# Patient Record
Sex: Female | Born: 1985 | Hispanic: Yes | Marital: Married | Smoking: Never smoker
Health system: Southern US, Community
[De-identification: ages and names within clinical notes are randomized; demographics above are authoritative.]

## PROBLEM LIST (undated history)

## (undated) ENCOUNTER — Inpatient Hospital Stay (HOSPITAL_COMMUNITY): Payer: Self-pay

---

## 2007-08-16 ENCOUNTER — Emergency Department (HOSPITAL_COMMUNITY): Admission: EM | Admit: 2007-08-16 | Discharge: 2007-08-16 | Payer: Self-pay | Admitting: Emergency Medicine

## 2012-05-06 ENCOUNTER — Emergency Department (HOSPITAL_COMMUNITY)
Admission: EM | Admit: 2012-05-06 | Discharge: 2012-05-06 | Disposition: A | Discharge status: Home / Self Care | Payer: No Typology Code available for payment source | Attending: Emergency Medicine | Admitting: Emergency Medicine

## 2012-05-06 ENCOUNTER — Emergency Department (HOSPITAL_COMMUNITY): Payer: No Typology Code available for payment source

## 2012-05-06 ENCOUNTER — Encounter (HOSPITAL_COMMUNITY): Payer: Self-pay | Admitting: Family Medicine

## 2012-05-06 DIAGNOSIS — S0990XA Unspecified injury of head, initial encounter: Secondary | ICD-10-CM | POA: Insufficient documentation

## 2012-05-06 DIAGNOSIS — Y9389 Activity, other specified: Secondary | ICD-10-CM | POA: Insufficient documentation

## 2012-05-06 DIAGNOSIS — S139XXA Sprain of joints and ligaments of unspecified parts of neck, initial encounter: Secondary | ICD-10-CM

## 2012-05-06 DIAGNOSIS — Y92410 Unspecified street and highway as the place of occurrence of the external cause: Secondary | ICD-10-CM | POA: Insufficient documentation

## 2012-05-06 DIAGNOSIS — S298XXA Other specified injuries of thorax, initial encounter: Secondary | ICD-10-CM | POA: Insufficient documentation

## 2012-05-06 MED ORDER — CYCLOBENZAPRINE HCL 10 MG PO TABS: 10.0000 mg | ORAL_TABLET | Freq: Three times a day (TID) | ORAL | Status: DC | PRN

## 2012-05-06 MED ORDER — HYDROCODONE-ACETAMINOPHEN 5-325 MG PO TABS: 1.0000 | ORAL_TABLET | ORAL | Status: DC | PRN

## 2012-05-06 MED ORDER — ACETAMINOPHEN 325 MG PO TABS
650.0000 mg | ORAL_TABLET | Freq: Once | ORAL | Status: AC
  Administered 2012-05-06: 650 mg via ORAL
  Filled 2012-05-06: qty 2
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
## 2012-11-18 ENCOUNTER — Ambulatory Visit (HOSPITAL_COMMUNITY)
Admission: RE | Admit: 2012-11-18 | Discharge: 2012-11-18 | Disposition: A | Discharge status: Home / Self Care | Payer: Self-pay | Source: Ambulatory Visit | Attending: Nurse Practitioner | Admitting: Nurse Practitioner

## 2012-11-18 ENCOUNTER — Other Ambulatory Visit (HOSPITAL_COMMUNITY): Payer: Self-pay | Admitting: Nurse Practitioner

## 2012-11-18 DIAGNOSIS — R05 Cough: Secondary | ICD-10-CM

## 2012-11-18 DIAGNOSIS — R059 Cough, unspecified: Secondary | ICD-10-CM | POA: Insufficient documentation

## 2013-04-16 ENCOUNTER — Other Ambulatory Visit (HOSPITAL_COMMUNITY): Payer: Self-pay | Admitting: Family Medicine

## 2013-04-16 DIAGNOSIS — N63 Unspecified lump in unspecified breast: Secondary | ICD-10-CM

## 2013-04-21 ENCOUNTER — Ambulatory Visit (HOSPITAL_COMMUNITY)
Admission: RE | Admit: 2013-04-21 | Discharge: 2013-04-21 | Disposition: A | Discharge status: Home / Self Care | Payer: BC Managed Care – PPO | Source: Ambulatory Visit | Attending: Family Medicine | Admitting: Family Medicine

## 2013-04-21 DIAGNOSIS — N644 Mastodynia: Secondary | ICD-10-CM | POA: Insufficient documentation

## 2013-04-21 DIAGNOSIS — N63 Unspecified lump in unspecified breast: Secondary | ICD-10-CM

## 2014-12-16 ENCOUNTER — Other Ambulatory Visit (HOSPITAL_COMMUNITY): Payer: Self-pay | Admitting: Family Medicine

## 2014-12-16 DIAGNOSIS — N63 Unspecified lump in unspecified breast: Secondary | ICD-10-CM

## 2014-12-27 ENCOUNTER — Ambulatory Visit (HOSPITAL_COMMUNITY): Payer: Self-pay

## 2014-12-27 ENCOUNTER — Ambulatory Visit (HOSPITAL_COMMUNITY)
Admission: RE | Admit: 2014-12-27 | Discharge: 2014-12-27 | Disposition: A | Discharge status: Home / Self Care | Payer: BLUE CROSS/BLUE SHIELD | Source: Ambulatory Visit | Attending: Family Medicine | Admitting: Family Medicine

## 2014-12-27 DIAGNOSIS — N63 Unspecified lump in unspecified breast: Secondary | ICD-10-CM

## 2014-12-27 DIAGNOSIS — N6002 Solitary cyst of left breast: Secondary | ICD-10-CM | POA: Insufficient documentation

## 2015-05-30 ENCOUNTER — Other Ambulatory Visit (HOSPITAL_COMMUNITY): Payer: Self-pay | Admitting: Registered Nurse

## 2015-05-30 DIAGNOSIS — N6002 Solitary cyst of left breast: Secondary | ICD-10-CM

## 2015-07-04 ENCOUNTER — Other Ambulatory Visit (HOSPITAL_COMMUNITY): Payer: BLUE CROSS/BLUE SHIELD

## 2015-07-04 ENCOUNTER — Ambulatory Visit (HOSPITAL_COMMUNITY)
Admission: RE | Admit: 2015-07-04 | Discharge: 2015-07-04 | Disposition: A | Discharge status: Home / Self Care | Payer: BLUE CROSS/BLUE SHIELD | Source: Ambulatory Visit | Attending: Registered Nurse | Admitting: Registered Nurse

## 2015-07-04 DIAGNOSIS — N6002 Solitary cyst of left breast: Secondary | ICD-10-CM | POA: Diagnosis not present

## 2015-07-04 DIAGNOSIS — N63 Unspecified lump in breast: Secondary | ICD-10-CM | POA: Insufficient documentation

## 2015-12-18 DIAGNOSIS — Z1389 Encounter for screening for other disorder: Secondary | ICD-10-CM | POA: Diagnosis not present

## 2015-12-18 DIAGNOSIS — R1310 Dysphagia, unspecified: Secondary | ICD-10-CM | POA: Diagnosis not present

## 2015-12-18 DIAGNOSIS — R110 Nausea: Secondary | ICD-10-CM | POA: Diagnosis not present

## 2015-12-18 DIAGNOSIS — Z0001 Encounter for general adult medical examination with abnormal findings: Secondary | ICD-10-CM | POA: Diagnosis not present

## 2015-12-18 DIAGNOSIS — Z6830 Body mass index (BMI) 30.0-30.9, adult: Secondary | ICD-10-CM | POA: Diagnosis not present

## 2015-12-19 DIAGNOSIS — Z0000 Encounter for general adult medical examination without abnormal findings: Secondary | ICD-10-CM | POA: Diagnosis not present

## 2015-12-19 DIAGNOSIS — Z6830 Body mass index (BMI) 30.0-30.9, adult: Secondary | ICD-10-CM | POA: Diagnosis not present

## 2015-12-19 DIAGNOSIS — Z1389 Encounter for screening for other disorder: Secondary | ICD-10-CM | POA: Diagnosis not present

## 2015-12-19 DIAGNOSIS — E6609 Other obesity due to excess calories: Secondary | ICD-10-CM | POA: Diagnosis not present

## 2015-12-20 ENCOUNTER — Encounter: Payer: Self-pay | Admitting: Internal Medicine

## 2016-01-01 ENCOUNTER — Ambulatory Visit: Payer: BLUE CROSS/BLUE SHIELD | Admitting: Gastroenterology

## 2016-01-17 ENCOUNTER — Ambulatory Visit: Payer: BLUE CROSS/BLUE SHIELD | Admitting: Gastroenterology

## 2016-01-18 ENCOUNTER — Ambulatory Visit (INDEPENDENT_AMBULATORY_CARE_PROVIDER_SITE_OTHER): Payer: BLUE CROSS/BLUE SHIELD | Admitting: Gastroenterology

## 2016-01-18 ENCOUNTER — Encounter: Payer: Self-pay | Admitting: Gastroenterology

## 2016-01-18 DIAGNOSIS — R194 Change in bowel habit: Secondary | ICD-10-CM | POA: Diagnosis not present

## 2016-01-18 DIAGNOSIS — R1030 Lower abdominal pain, unspecified: Secondary | ICD-10-CM

## 2016-01-18 DIAGNOSIS — R6881 Early satiety: Secondary | ICD-10-CM

## 2016-01-18 DIAGNOSIS — R1310 Dysphagia, unspecified: Secondary | ICD-10-CM | POA: Diagnosis not present

## 2016-01-18 MED ORDER — OMEPRAZOLE MAGNESIUM 20 MG PO TBEC: 20.0000 mg | DELAYED_RELEASE_TABLET | Freq: Every day | ORAL | 0 refills | Status: DC

## 2016-01-18 MED ORDER — DEXLANSOPRAZOLE 60 MG PO CPDR: 60.0000 mg | DELAYED_RELEASE_CAPSULE | Freq: Every day | ORAL | 0 refills | Status: DC

## 2016-01-18 NOTE — Progress Notes (Signed)
Primary Care Physician:  Glo Herring., MD  Primary Gastroenterologist:  Garfield Cornea, MD   Chief Complaint  Patient presents with  . Dysphagia    feels like food gets stuck, chest pain at times  . Abdominal Pain    lower mid abd, pain right groin  . Nausea  . Constipation  . Bloated    HPI:  Cheryl Aguilar is a 30 y.o. female here at the request of PCP for dysphagia. She has multiple GI complaints today. Complains of several month h/o pp early satiety, nausea but no vomiting. Can only eat much less than usual and then complains of fullness and discomfort. Has to get up and walk around. Stomach becomes tight. Sometimes relief if passes gas. Complains of lower abdominal pain as well with some pain into right groin. Some relief with BM. Present now for couple of months. She also has had change in bowels, use to have BM 3 times pr day. Now only once daily. Has to sit for awhile to get BM to come. No melena, brbpr. No heartburn. Feels like food sits in throat, especially grape tomatoes. Took Nexium 24hr for about 5-7 days. No significant improvement. Denies ASA powders. Rare NSAID use.   Menstrual cycles regular.   Current Outpatient Prescriptions  Medication Sig Dispense Refill  . ibuprofen (ADVIL,MOTRIN) 200 MG tablet Take 400 mg by mouth every 6 (six) hours as needed for pain.    .        No current facility-administered medications for this visit.     Allergies as of 01/18/2016  . (No Known Allergies)    No pertinent past medical history.  History reviewed. No pertinent surgical history.  Family History  Problem Relation Age of Onset  . Diabetes Mother   . Diabetes Brother   . Esophageal cancer Paternal Grandfather   . Colon cancer Neg Hx     Social History   Social History  . Marital status: Single    Spouse name: N/A  . Number of children: N/A  . Years of education: N/A   Occupational History  . Not on file.   Social History Main Topics  . Smoking  status: Never Smoker  . Smokeless tobacco: Never Used  . Alcohol use No  . Drug use: No  . Sexual activity: Yes    Partners: Male     Comment: husband had vasectomy 2014   Other Topics Concern  . Not on file   Social History Narrative  . No narrative on file      ROS:  General: Negative for anorexia, weight loss, fever, chills, fatigue, weakness. Eyes: Negative for vision changes.  ENT: Negative for hoarseness, nasal congestion.see hpi CV: Negative for chest pain, angina, palpitations, dyspnea on exertion, peripheral edema.  Respiratory: Negative for dyspnea at rest, dyspnea on exertion, cough, sputum, wheezing.  GI: See history of present illness. GU:  Negative for dysuria, hematuria, urinary incontinence, urinary frequency, nocturnal urination.  MS: Negative for joint pain, low back pain.  Derm: Negative for rash or itching.  Neuro: Negative for weakness, abnormal sensation, seizure, frequent headaches, memory loss, confusion.  Psych: Negative for anxiety, depression, suicidal ideation, hallucinations.  Endo: Negative for unusual weight change.  Heme: Negative for bruising or bleeding. Allergy: Negative for rash or hives.    Physical Examination:  BP 113/75   Pulse 89   Temp 98.2 F (36.8 C) (Oral)   Ht 5\' 4"  (1.626 m)   Wt 174 lb 9.6 oz (79.2 kg)  LMP 01/05/2016 (Exact Date)   BMI 29.97 kg/m    General: Well-nourished, well-developed in no acute distress.  Head: Normocephalic, atraumatic.   Eyes: Conjunctiva pink, no icterus. Mouth: Oropharyngeal mucosa moist and pink , no lesions erythema or exudate. Neck: Supple without thyromegaly, masses, or lymphadenopathy.  Lungs: Clear to auscultation bilaterally.  Heart: Regular rate and rhythm, no murmurs rubs or gallops.  Abdomen: Bowel sounds are normal, mild epig tenderness and lower abd tenderness, nondistended, no hepatosplenomegaly or masses, no abdominal bruits or    hernia , no rebound or guarding.   Rectal:  not performed Extremities: No lower extremity edema. No clubbing or deformities.  Neuro: Alert and oriented x 4 , grossly normal neurologically.  Skin: Warm and dry, no rash or jaundice.   Psych: Alert and cooperative, normal mood and affect.  Labs: requested  Imaging Studies: No results found.

## 2016-01-18 NOTE — Assessment & Plan Note (Signed)
Cannot rule out gastritis, h.pylori, GERD. Trial of PPI. Await records on labs. Needs to be checked for H.pylori if not already.

## 2016-01-18 NOTE — Assessment & Plan Note (Signed)
Lower abdominal pain in setting of bowel change, decreased stool frequency and incomplete stools. Add Miralax as outlined. PR in 2 weeks. Requested labs from PCP for review.

## 2016-01-18 NOTE — Patient Instructions (Signed)
1. Dexilant once daily before breakfast. Samples provided. Call in 2 weeks and let me know if any of your symptoms are better.  2. Start Miralax one capful at bedtime daily for 2 weeks. You can buy over the counter. If you continue to have abdominal pain, bloating with improved bowel function then we will need to do more testing. Call in 2 weeks and let me know how you are doing.  3. I will obtain copy of your recent labs. If you have not been checked for H.pylori or your thyroid function has not been checked we will let you know.

## 2016-01-18 NOTE — Assessment & Plan Note (Signed)
30 y/o female with vague h/o dysphagia to certain foods. Feels like sticking in throat. No typical heartburn but vague discomfort in chest especially in the morning. ?atypical GERD. No trial of PPI for significant period of time. Will start omeprazole 20mg  daily for two weeks. Samples provided. PR in 2 weeks via phone. Should see some improvement at that time, if not consider further work up.

## 2016-01-19 NOTE — Progress Notes (Signed)
cc'ed to pcp °

## 2016-01-23 ENCOUNTER — Telehealth: Payer: Self-pay

## 2016-01-23 DIAGNOSIS — R1013 Epigastric pain: Secondary | ICD-10-CM

## 2016-01-23 NOTE — Telephone Encounter (Signed)
Pt called office and said she was given Prilosec samples last week at [facility]. States the feeling of food getting stuck is better, but nausea is worse. No vomiting. No fever. Feels sluggish. Nausea occurs after eating. Ate a salad today and then felt nauseated. Asked if something for nausea could be sent in to [facility] in [place].

## 2016-01-24 MED ORDER — ONDANSETRON 4 MG PO TBDP: 4.0000 mg | ORAL_TABLET | Freq: Three times a day (TID) | ORAL | 0 refills | Status: DC | PRN

## 2016-01-24 NOTE — Telephone Encounter (Signed)
LMOVM and informed pt.

## 2016-01-24 NOTE — Addendum Note (Signed)
Addended by: Mahala Menghini on: 01/24/2016 02:08 PM   Modules accepted: Orders

## 2016-01-24 NOTE — Telephone Encounter (Signed)
RX for nausea med sent in. Too soon to tell if meds will help. Continue recommendations as per OV note.   I still need copy of her labs from PCP.

## 2016-02-02 ENCOUNTER — Telehealth: Payer: Self-pay

## 2016-02-02 NOTE — Telephone Encounter (Signed)
Labs reviewed from PCP dated 12/19/2015 White blood cell count 8200, hemoglobin 13.3, hematocrit 39.9, platelets 343,000, BUN 14, creatinine less than 0.57, sodium 139, total bilirubin less than 0.3, alkaline phosphatase 99, AST 17, ALT 15, albumin 4.3, TSH 2.950   Please touch base with patient.  Her TSH was normal in 12/2015. I don't see where she was checked for H.pylori.   I would advise her to have labs for H.pylori, [facility] still does the serologies.   Return to the office in 3 weeks. If no significant improvement in her symptoms, and her H.pylori is negative, then we will likely offer her EGD/ED at that time.

## 2016-02-02 NOTE — Telephone Encounter (Signed)
Pt called and will come by to pick up the lab orders. She is aware of our office closing times.  Orders left at front for pick up.

## 2016-02-02 NOTE — Telephone Encounter (Signed)
See separate note. I have spoke to pt.

## 2016-02-02 NOTE — Telephone Encounter (Signed)
Pt was returning a call to DS. Please call her back at [phone]

## 2016-02-02 NOTE — Addendum Note (Signed)
Addended by: Mahala Menghini on: 02/02/2016 08:12 AM   Modules accepted: Orders

## 2016-02-02 NOTE — Telephone Encounter (Signed)
LMOM to call soon, we are off at noon today until next Wed.

## 2016-02-20 ENCOUNTER — Encounter: Payer: Self-pay | Admitting: Internal Medicine

## 2016-03-15 ENCOUNTER — Other Ambulatory Visit: Payer: Self-pay | Admitting: Registered Nurse

## 2016-03-15 DIAGNOSIS — N6320 Unspecified lump in the left breast, unspecified quadrant: Secondary | ICD-10-CM

## 2016-04-12 DIAGNOSIS — Z23 Encounter for immunization: Secondary | ICD-10-CM | POA: Diagnosis not present

## 2016-06-06 DIAGNOSIS — Z1389 Encounter for screening for other disorder: Secondary | ICD-10-CM | POA: Diagnosis not present

## 2016-06-06 DIAGNOSIS — L299 Pruritus, unspecified: Secondary | ICD-10-CM | POA: Diagnosis not present

## 2016-06-06 DIAGNOSIS — Z23 Encounter for immunization: Secondary | ICD-10-CM | POA: Diagnosis not present

## 2016-06-06 DIAGNOSIS — B354 Tinea corporis: Secondary | ICD-10-CM | POA: Diagnosis not present

## 2016-07-04 DIAGNOSIS — Z1389 Encounter for screening for other disorder: Secondary | ICD-10-CM | POA: Diagnosis not present

## 2016-07-04 DIAGNOSIS — S70269A Insect bite (nonvenomous), unspecified hip, initial encounter: Secondary | ICD-10-CM | POA: Diagnosis not present

## 2016-07-04 DIAGNOSIS — E663 Overweight: Secondary | ICD-10-CM | POA: Diagnosis not present

## 2016-07-04 DIAGNOSIS — Z6829 Body mass index (BMI) 29.0-29.9, adult: Secondary | ICD-10-CM | POA: Diagnosis not present

## 2016-10-07 ENCOUNTER — Other Ambulatory Visit (HOSPITAL_COMMUNITY): Payer: Self-pay | Admitting: Family Medicine

## 2016-10-07 DIAGNOSIS — N6320 Unspecified lump in the left breast, unspecified quadrant: Secondary | ICD-10-CM

## 2016-12-19 DIAGNOSIS — Z6827 Body mass index (BMI) 27.0-27.9, adult: Secondary | ICD-10-CM | POA: Diagnosis not present

## 2016-12-19 DIAGNOSIS — E663 Overweight: Secondary | ICD-10-CM | POA: Diagnosis not present

## 2016-12-19 DIAGNOSIS — Z124 Encounter for screening for malignant neoplasm of cervix: Secondary | ICD-10-CM | POA: Diagnosis not present

## 2016-12-19 DIAGNOSIS — Z1389 Encounter for screening for other disorder: Secondary | ICD-10-CM | POA: Diagnosis not present

## 2016-12-19 DIAGNOSIS — Z23 Encounter for immunization: Secondary | ICD-10-CM | POA: Diagnosis not present

## 2016-12-19 DIAGNOSIS — Z0000 Encounter for general adult medical examination without abnormal findings: Secondary | ICD-10-CM | POA: Diagnosis not present

## 2017-10-01 DIAGNOSIS — Z1389 Encounter for screening for other disorder: Secondary | ICD-10-CM | POA: Diagnosis not present

## 2017-10-01 DIAGNOSIS — J019 Acute sinusitis, unspecified: Secondary | ICD-10-CM | POA: Diagnosis not present

## 2017-10-01 DIAGNOSIS — J301 Allergic rhinitis due to pollen: Secondary | ICD-10-CM | POA: Diagnosis not present

## 2017-10-01 DIAGNOSIS — Z6830 Body mass index (BMI) 30.0-30.9, adult: Secondary | ICD-10-CM | POA: Diagnosis not present

## 2017-10-01 DIAGNOSIS — E6609 Other obesity due to excess calories: Secondary | ICD-10-CM | POA: Diagnosis not present

## 2017-12-26 DIAGNOSIS — Z6830 Body mass index (BMI) 30.0-30.9, adult: Secondary | ICD-10-CM | POA: Diagnosis not present

## 2017-12-26 DIAGNOSIS — E6609 Other obesity due to excess calories: Secondary | ICD-10-CM | POA: Diagnosis not present

## 2017-12-26 DIAGNOSIS — Z23 Encounter for immunization: Secondary | ICD-10-CM | POA: Diagnosis not present

## 2017-12-26 DIAGNOSIS — Z1389 Encounter for screening for other disorder: Secondary | ICD-10-CM | POA: Diagnosis not present

## 2017-12-26 DIAGNOSIS — Z0001 Encounter for general adult medical examination with abnormal findings: Secondary | ICD-10-CM | POA: Diagnosis not present

## 2018-01-21 DIAGNOSIS — Z23 Encounter for immunization: Secondary | ICD-10-CM | POA: Diagnosis not present

## 2018-01-21 DIAGNOSIS — Z6830 Body mass index (BMI) 30.0-30.9, adult: Secondary | ICD-10-CM | POA: Diagnosis not present

## 2018-01-21 DIAGNOSIS — Z0001 Encounter for general adult medical examination with abnormal findings: Secondary | ICD-10-CM | POA: Diagnosis not present

## 2018-01-21 DIAGNOSIS — Z1389 Encounter for screening for other disorder: Secondary | ICD-10-CM | POA: Diagnosis not present

## 2018-04-28 DIAGNOSIS — S76012A Strain of muscle, fascia and tendon of left hip, initial encounter: Secondary | ICD-10-CM | POA: Diagnosis not present

## 2018-04-28 DIAGNOSIS — M779 Enthesopathy, unspecified: Secondary | ICD-10-CM | POA: Diagnosis not present

## 2018-04-28 DIAGNOSIS — Z1389 Encounter for screening for other disorder: Secondary | ICD-10-CM | POA: Diagnosis not present

## 2018-04-28 DIAGNOSIS — E6609 Other obesity due to excess calories: Secondary | ICD-10-CM | POA: Diagnosis not present

## 2018-04-28 DIAGNOSIS — Z6831 Body mass index (BMI) 31.0-31.9, adult: Secondary | ICD-10-CM | POA: Diagnosis not present

## 2018-10-06 DIAGNOSIS — N924 Excessive bleeding in the premenopausal period: Secondary | ICD-10-CM | POA: Diagnosis not present

## 2018-10-06 DIAGNOSIS — N920 Excessive and frequent menstruation with regular cycle: Secondary | ICD-10-CM | POA: Diagnosis not present

## 2018-10-06 DIAGNOSIS — Z6832 Body mass index (BMI) 32.0-32.9, adult: Secondary | ICD-10-CM | POA: Diagnosis not present

## 2018-10-06 DIAGNOSIS — E669 Obesity, unspecified: Secondary | ICD-10-CM | POA: Diagnosis not present

## 2018-10-23 DIAGNOSIS — M79621 Pain in right upper arm: Secondary | ICD-10-CM | POA: Diagnosis not present

## 2018-10-23 DIAGNOSIS — E6609 Other obesity due to excess calories: Secondary | ICD-10-CM | POA: Diagnosis not present

## 2018-10-23 DIAGNOSIS — N924 Excessive bleeding in the premenopausal period: Secondary | ICD-10-CM | POA: Diagnosis not present

## 2018-10-23 DIAGNOSIS — Z6831 Body mass index (BMI) 31.0-31.9, adult: Secondary | ICD-10-CM | POA: Diagnosis not present

## 2018-11-03 DIAGNOSIS — N920 Excessive and frequent menstruation with regular cycle: Secondary | ICD-10-CM | POA: Diagnosis not present

## 2018-11-03 DIAGNOSIS — D649 Anemia, unspecified: Secondary | ICD-10-CM | POA: Diagnosis not present

## 2018-11-03 DIAGNOSIS — E039 Hypothyroidism, unspecified: Secondary | ICD-10-CM | POA: Diagnosis not present

## 2018-11-03 DIAGNOSIS — E063 Autoimmune thyroiditis: Secondary | ICD-10-CM | POA: Diagnosis not present

## 2018-11-03 DIAGNOSIS — N938 Other specified abnormal uterine and vaginal bleeding: Secondary | ICD-10-CM | POA: Diagnosis not present

## 2018-11-03 DIAGNOSIS — Z6831 Body mass index (BMI) 31.0-31.9, adult: Secondary | ICD-10-CM | POA: Diagnosis not present

## 2018-11-03 DIAGNOSIS — N924 Excessive bleeding in the premenopausal period: Secondary | ICD-10-CM | POA: Diagnosis not present

## 2019-02-02 ENCOUNTER — Other Ambulatory Visit: Payer: Self-pay | Admitting: Obstetrics and Gynecology

## 2019-02-02 DIAGNOSIS — Z01419 Encounter for gynecological examination (general) (routine) without abnormal findings: Secondary | ICD-10-CM | POA: Diagnosis not present

## 2019-02-02 DIAGNOSIS — Z6831 Body mass index (BMI) 31.0-31.9, adult: Secondary | ICD-10-CM | POA: Diagnosis not present

## 2019-02-02 DIAGNOSIS — N6320 Unspecified lump in the left breast, unspecified quadrant: Secondary | ICD-10-CM | POA: Diagnosis not present

## 2019-02-02 DIAGNOSIS — E039 Hypothyroidism, unspecified: Secondary | ICD-10-CM | POA: Insufficient documentation

## 2019-02-02 DIAGNOSIS — N926 Irregular menstruation, unspecified: Secondary | ICD-10-CM | POA: Diagnosis not present

## 2019-02-02 DIAGNOSIS — N6332 Unspecified lump in axillary tail of the left breast: Secondary | ICD-10-CM

## 2019-02-10 DIAGNOSIS — E785 Hyperlipidemia, unspecified: Secondary | ICD-10-CM | POA: Diagnosis not present

## 2019-02-10 DIAGNOSIS — Z13220 Encounter for screening for lipoid disorders: Secondary | ICD-10-CM | POA: Diagnosis not present

## 2019-02-16 DIAGNOSIS — N926 Irregular menstruation, unspecified: Secondary | ICD-10-CM | POA: Diagnosis not present

## 2019-02-19 ENCOUNTER — Other Ambulatory Visit: Payer: Self-pay

## 2019-03-11 ENCOUNTER — Other Ambulatory Visit: Payer: Self-pay

## 2019-03-11 ENCOUNTER — Ambulatory Visit
Admission: RE | Admit: 2019-03-11 | Discharge: 2019-03-11 | Disposition: A | Discharge status: Home / Self Care | Payer: BC Managed Care – PPO | Source: Ambulatory Visit | Attending: Obstetrics and Gynecology | Admitting: Obstetrics and Gynecology

## 2019-03-11 ENCOUNTER — Other Ambulatory Visit: Payer: Self-pay | Admitting: Obstetrics and Gynecology

## 2019-03-11 DIAGNOSIS — N6312 Unspecified lump in the right breast, upper inner quadrant: Secondary | ICD-10-CM | POA: Diagnosis not present

## 2019-03-11 DIAGNOSIS — N6332 Unspecified lump in axillary tail of the left breast: Secondary | ICD-10-CM

## 2019-03-11 DIAGNOSIS — N6310 Unspecified lump in the right breast, unspecified quadrant: Secondary | ICD-10-CM

## 2019-03-11 DIAGNOSIS — N6002 Solitary cyst of left breast: Secondary | ICD-10-CM | POA: Diagnosis not present

## 2019-06-01 ENCOUNTER — Other Ambulatory Visit: Payer: Self-pay

## 2019-06-01 ENCOUNTER — Ambulatory Visit
Admission: EM | Admit: 2019-06-01 | Discharge: 2019-06-01 | Disposition: A | Discharge status: Home / Self Care | Payer: BC Managed Care – PPO | Attending: Emergency Medicine | Admitting: Emergency Medicine

## 2019-06-01 ENCOUNTER — Encounter: Payer: Self-pay | Admitting: Emergency Medicine

## 2019-06-01 DIAGNOSIS — Z113 Encounter for screening for infections with a predominantly sexual mode of transmission: Secondary | ICD-10-CM | POA: Diagnosis not present

## 2019-06-01 DIAGNOSIS — Z79899 Other long term (current) drug therapy: Secondary | ICD-10-CM | POA: Diagnosis not present
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
## 2019-06-01 NOTE — ED Provider Notes (Signed)
[facility]   [id number] 06/01/19 Arrival Time: 1857   [id number] DISCHARGE  SUBJECTIVE:  Cheryl Aguilar is a 34 y.o. female who presents with complaints of STD screening.  She denies a precipitating event, recent sexual encounter or recent antibiotic use.  Patient is sexually active with 1 female partner.  Denies vaginal discharge.  Denies similar symptoms in the past she denies fever, chills, nausea, vomiting, abdominal or pelvic pain, urinary symptoms, vaginal itching, vaginal odor, vaginal bleeding, dyspareunia, vaginal rashes or lesions.   No LMP recorded. Current birth control method: Compliant with BC:  ROS: As per HPI.  All other pertinent ROS negative.     History reviewed. No pertinent past medical history. History reviewed. No pertinent surgical history. No Known Allergies No current facility-administered medications on file prior to encounter.   Current Outpatient Medications on File Prior to Encounter  Medication Sig Dispense Refill  . ibuprofen (ADVIL,MOTRIN) 200 MG tablet Take 400 mg by mouth every 6 (six) hours as needed for pain.    Marland Kitchen omeprazole (PRILOSEC OTC) 20 MG tablet Take 1 tablet (20 mg total) by mouth daily. 28 tablet 0  . ondansetron (ZOFRAN) 4 MG tablet Take 4 mg by mouth every 8 (eight) hours as needed for nausea or vomiting.    . ondansetron (ZOFRAN-ODT) 4 MG disintegrating tablet Take 1 tablet (4 mg total) by mouth every 8 (eight) hours as needed for nausea or vomiting. (Patient not taking: Reported on 06/01/2019) 30 tablet 0    Social History   Socioeconomic History  . Marital status: Single    Spouse name: Not on file  . Number of children: Not on file  . Years of education: Not on file  . Highest education level: Not on file  Occupational History  . Not on file  Tobacco Use  . Smoking status: Never Smoker  . Smokeless tobacco: Never Used  Substance and Sexual Activity  . Alcohol use: No  . Drug use: No  . Sexual activity:  Yes    Partners: Male    Comment: husband had vasectomy 2014  Other Topics Concern  . Not on file  Social History Narrative  . Not on file   Social Determinants of Health   Financial Resource Strain:   . Difficulty of Paying Living Expenses:   Food Insecurity:   . Worried About Charity fundraiser in the Last Year:   . Arboriculturist in the Last Year:   Transportation Needs:   . Film/video editor (Medical):   Marland Kitchen Lack of Transportation (Non-Medical):   Physical Activity:   . Days of Exercise per Week:   . Minutes of Exercise per Session:   Stress:   . Feeling of Stress :   Social Connections:   . Frequency of Communication with Friends and Family:   . Frequency of Social Gatherings with Friends and Family:   . Attends Religious Services:   . Active Member of Clubs or Organizations:   . Attends Archivist Meetings:   Marland Kitchen Marital Status:   Intimate Partner Violence:   . Fear of Current or Ex-Partner:   . Emotionally Abused:   Marland Kitchen Physically Abused:   . Sexually Abused:    Family History  Problem Relation Age of Onset  . Diabetes Mother   . Diabetes Brother   . Esophageal cancer Paternal Grandfather   . Colon cancer Neg Hx     OBJECTIVE:  Vitals:   06/01/19 1903  BP: 129/74  Pulse: 77  Resp: 17  Temp: 98.2 F (36.8 C)  TempSrc: Oral  SpO2: 98%     General appearance: Alert, NAD, appears stated age Head: NCAT Throat: lips, mucosa, and tongue normal; teeth and gums normal Lungs: CTA bilaterally without adventitious breath sounds Heart: regular rate and rhythm.  Radial pulses 2+ symmetrical bilaterally Back: no CVA tenderness Abdomen: soft, non-tender; bowel sounds normal; no masses or organomegaly; no guarding or rebound tenderness GU: Vaginal self swab obtained Skin: warm and dry Psychological:  Alert and cooperative. Normal mood and affect.  LABS:  No results found for this or any previous visit.  Labs Reviewed  CERVICOVAGINAL ANCILLARY  ONLY    ASSESSMENT & PLAN:  1. Screening for STD (sexually transmitted disease)     No orders of the defined types were placed in this encounter.   Pending: Labs Reviewed  CERVICOVAGINAL ANCILLARY ONLY     Discharge instruction   Vaginal self-swab obtained.  We will call you if results are abnormal If tests results are positive, please abstain from sexual activity until you and your partner(s) have been treated Follow up with PCP or [facility] if symptoms persists Return here or go to ER if you have any new or worsening symptoms fever, chills, nausea, vomiting, abdominal or pelvic pain, painful intercourse, vaginal discharge, vaginal bleeding, persistent symptoms despite treatment, etc...  Reviewed expectations re: course of current medical issues. Questions answered. Outlined signs and symptoms indicating need for more acute intervention. Patient verbalized understanding. After Visit Summary given.       Emerson Monte, FNP 06/01/19 1932

## 2019-06-01 NOTE — Discharge Instructions (Addendum)
Vaginal self-swab obtained.  We will call you if results are abnormal If tests results are positive, please abstain from sexual activity until you and your partner(s) have been treated Follow up with PCP or [facility] if symptoms persists Return here or go to ER if you have any new or worsening symptoms fever, chills, nausea, vomiting, abdominal or pelvic pain, painful intercourse, vaginal discharge, vaginal bleeding, persistent symptoms despite treatment, etc..Marland Kitchen

## 2019-06-02 ENCOUNTER — Telehealth: Payer: Self-pay | Admitting: Emergency Medicine

## 2019-06-02 NOTE — Telephone Encounter (Signed)
STD testing reordered

## 2019-06-03 ENCOUNTER — Telehealth (HOSPITAL_COMMUNITY): Payer: Self-pay

## 2019-06-03 DIAGNOSIS — B3731 Acute candidiasis of vulva and vagina: Secondary | ICD-10-CM

## 2019-06-03 DIAGNOSIS — B373 Candidiasis of vulva and vagina: Secondary | ICD-10-CM

## 2019-06-03 LAB — CERVICOVAGINAL ANCILLARY ONLY
Bacterial Vaginitis (gardnerella): NEGATIVE
Candida Glabrata: POSITIVE — AB
Candida Vaginitis: NEGATIVE
Chlamydia: NEGATIVE
Comment: NEGATIVE
Comment: NEGATIVE
Comment: NEGATIVE
Comment: NEGATIVE
Comment: NEGATIVE
Comment: NORMAL
Neisseria Gonorrhea: NEGATIVE
Trichomonas: NEGATIVE

## 2019-06-03 MED ORDER — FLUCONAZOLE 150 MG PO TABS: ORAL_TABLET | ORAL | 0 refills | Status: DC

## 2019-06-03 NOTE — Telephone Encounter (Signed)
Patient contacted by phone and made aware of results. Sent Diflucan 150mg  #2 to pharmacy of choice.  Pt verbalized understanding and had all questions answered.

## 2019-09-14 ENCOUNTER — Other Ambulatory Visit: Payer: BC Managed Care – PPO

## 2019-10-05 ENCOUNTER — Other Ambulatory Visit: Payer: Self-pay | Admitting: Obstetrics and Gynecology

## 2019-10-05 ENCOUNTER — Ambulatory Visit
Admission: RE | Admit: 2019-10-05 | Discharge: 2019-10-05 | Disposition: A | Discharge status: Home / Self Care | Payer: BC Managed Care – PPO | Source: Ambulatory Visit | Attending: Obstetrics and Gynecology | Admitting: Obstetrics and Gynecology

## 2019-10-05 ENCOUNTER — Other Ambulatory Visit: Payer: Self-pay

## 2019-10-05 DIAGNOSIS — N6320 Unspecified lump in the left breast, unspecified quadrant: Secondary | ICD-10-CM

## 2019-10-05 DIAGNOSIS — N6332 Unspecified lump in axillary tail of the left breast: Secondary | ICD-10-CM

## 2019-10-05 DIAGNOSIS — N6311 Unspecified lump in the right breast, upper outer quadrant: Secondary | ICD-10-CM | POA: Diagnosis not present

## 2019-10-05 DIAGNOSIS — N6002 Solitary cyst of left breast: Secondary | ICD-10-CM | POA: Diagnosis not present

## 2019-10-05 DIAGNOSIS — N6310 Unspecified lump in the right breast, unspecified quadrant: Secondary | ICD-10-CM

## 2019-10-05 DIAGNOSIS — R922 Inconclusive mammogram: Secondary | ICD-10-CM | POA: Diagnosis not present

## 2019-11-12 DIAGNOSIS — E6609 Other obesity due to excess calories: Secondary | ICD-10-CM | POA: Diagnosis not present

## 2019-11-12 DIAGNOSIS — Z1331 Encounter for screening for depression: Secondary | ICD-10-CM | POA: Diagnosis not present

## 2019-11-12 DIAGNOSIS — E063 Autoimmune thyroiditis: Secondary | ICD-10-CM | POA: Diagnosis not present

## 2019-11-12 DIAGNOSIS — Z6833 Body mass index (BMI) 33.0-33.9, adult: Secondary | ICD-10-CM | POA: Diagnosis not present

## 2019-11-12 DIAGNOSIS — Z0001 Encounter for general adult medical examination with abnormal findings: Secondary | ICD-10-CM | POA: Diagnosis not present

## 2020-02-23 ENCOUNTER — Ambulatory Visit
Admission: EM | Admit: 2020-02-23 | Discharge: 2020-02-23 | Disposition: A | Discharge status: Home / Self Care | Payer: BC Managed Care – PPO | Attending: Family Medicine | Admitting: Family Medicine

## 2020-02-23 ENCOUNTER — Other Ambulatory Visit: Payer: Self-pay

## 2020-02-23 DIAGNOSIS — Z1152 Encounter for screening for COVID-19: Secondary | ICD-10-CM

## 2020-02-23 NOTE — ED Triage Notes (Signed)
Needs covid test

## 2020-02-26 LAB — NOVEL CORONAVIRUS, NAA: SARS-CoV-2, NAA: NOT DETECTED

## 2020-04-10 ENCOUNTER — Other Ambulatory Visit: Payer: BC Managed Care – PPO

## 2020-05-10 ENCOUNTER — Other Ambulatory Visit: Payer: Self-pay

## 2020-06-14 ENCOUNTER — Ambulatory Visit
Admission: RE | Admit: 2020-06-14 | Discharge: 2020-06-14 | Disposition: A | Discharge status: Home / Self Care | Payer: No Typology Code available for payment source | Source: Ambulatory Visit | Attending: Obstetrics and Gynecology | Admitting: Obstetrics and Gynecology

## 2020-06-14 ENCOUNTER — Other Ambulatory Visit: Payer: Self-pay

## 2020-06-14 ENCOUNTER — Other Ambulatory Visit: Payer: Self-pay | Admitting: Obstetrics and Gynecology

## 2020-06-14 DIAGNOSIS — N6320 Unspecified lump in the left breast, unspecified quadrant: Secondary | ICD-10-CM

## 2020-06-14 DIAGNOSIS — N6310 Unspecified lump in the right breast, unspecified quadrant: Secondary | ICD-10-CM

## 2020-06-16 ENCOUNTER — Other Ambulatory Visit: Payer: No Typology Code available for payment source

## 2020-06-19 ENCOUNTER — Other Ambulatory Visit: Payer: Self-pay

## 2020-06-19 ENCOUNTER — Other Ambulatory Visit: Payer: Self-pay | Admitting: Obstetrics and Gynecology

## 2020-06-19 ENCOUNTER — Ambulatory Visit
Admission: RE | Admit: 2020-06-19 | Discharge: 2020-06-19 | Disposition: A | Discharge status: Home / Self Care | Payer: No Typology Code available for payment source | Source: Ambulatory Visit | Attending: Obstetrics and Gynecology | Admitting: Obstetrics and Gynecology

## 2020-06-19 DIAGNOSIS — N6320 Unspecified lump in the left breast, unspecified quadrant: Secondary | ICD-10-CM

## 2020-07-14 ENCOUNTER — Ambulatory Visit: Payer: Self-pay | Admitting: Surgery

## 2020-07-14 NOTE — H&P (Signed)
History of Present Illness Cheryl Aguilar. Yasseen Salls MD; 07/14/2020 10:35 AM) The patient is a 35 year old female who presents with a breast mass. 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 Recently she underwent mammogram as well as ultrasound. In the left breast at 2:00, 7 cm from the nipple there is a oval circumscribed mass that is enlarging. It is now 3 cm in greatest diameter. The right breast shows a 1.4 cm mass at 131 cm from the nipple that has not changed in the last 2 years. The left breast shows a collection of smaller hypoechoic masses at 1:00 2:00 and 7:00 that are relatively unchanged.   Problem List/Past Medical Rodman Key K. 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 No drug use Tobacco use Never smoker.  Family History Emeline Gins, [place]; 07/14/2020 10:19 AM) First Degree Relatives No pertinent family history  Pregnancy / Birth History Emeline Gins, [place]; 07/14/2020 10:19 AM) Age at menarche 59 years. Age of menopause <45 Gravida 0 Para 0 Regular periods  Other Problems Cheryl Aguilar. Tiyonna Sardinha, MD; 07/14/2020 10:35 AM) Thyroid Disease     Review of Systems Emeline Gins CMA; 07/14/2020 10:19 AM) General Not Present- Appetite Loss, Chills, Fatigue, Fever, Night Sweats, Weight Gain and Weight Loss. Skin  Not Present- Change in Wart/Mole, Dryness, Hives, Jaundice, New Lesions, Non-Healing Wounds, Rash and Ulcer. HEENT Present- Wears glasses/contact lenses. Not Present- Earache, Hearing Loss, Hoarseness, Nose Bleed, Oral Ulcers, Ringing in the Ears, Seasonal Allergies, Sinus Pain, Sore Throat, Visual Disturbances and Yellow Eyes. Respiratory Not Present- Bloody sputum, Chronic Cough, Difficulty Breathing, Snoring and Wheezing. Female Genitourinary Not Present- Frequency, Nocturia, Painful Urination, Pelvic Pain and Urgency. Hematology Not Present- Blood Thinners, Easy Bruising, Excessive bleeding, Gland problems, HIV and Persistent Infections.  Vitals Emeline Gins CMA; 07/14/2020 10:20 AM) 07/14/2020 10:20 AM Weight: 187 lb Height: 64in Body Surface Area: 1.9 m Body Mass Index: 32.1 kg/m  Temp.: 98.56F  Pulse: 86 (Regular)  BP: 118/84(Sitting, Left Arm, Standard)        Physical Exam Rodman Key K. Kacie Huxtable MD; 07/14/2020 10:36 AM)  The physical exam findings are as follows: Note:Constitutional: WDWN in NAD, conversant, no obvious deformities; resting comfortably Eyes: Pupils equal, round; sclera anicteric; moist conjunctiva; no lid lag HENT: Oral mucosa moist; good dentition Neck: No masses palpated, trachea midline; no thyromegaly Lungs: CTA bilaterally; normal respiratory effort Breasts: Symmetrical, no nipple changes, right breast shows no palpable masses or lymphadenopathy. The left breast shows a palpable 3 cm mass in the upper outer quadrant at 2:00. This is located about 6 cm from the center of the nipple. It is ultimately mobile. There are no overlying skin changes. No axillary lymphadenopathy. CV: Regular rate and rhythm; no murmurs; extremities well-perfused with no edema Abd: +bowel sounds, soft, non-tender, no palpable organomegaly; no palpable hernias Musc: Normal gait; no apparent clubbing or cyanosis in extremities Lymphatic: No palpable cervical or axillary  lymphadenopathy Skin: Warm, dry; no sign of  jaundice Psychiatric - alert and oriented x 4; calm mood and affect    Assessment & Plan Rodman Key K. 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 The surgical procedure has been discussed with the patient. Potential risks, benefits, alternative treatments, and expected outcomes have been explained. All of the patient's questions at this time have been answered. The likelihood of reaching the patient's treatment goal is good. The patient understand the proposed surgical procedure and wishes to proceed.  Cheryl Aguilar. Georgette Dover, MD, [facility]  General/ Trauma Surgery   07/14/2020 10:37 AM

## 2020-09-07 ENCOUNTER — Encounter (HOSPITAL_BASED_OUTPATIENT_CLINIC_OR_DEPARTMENT_OTHER): Payer: Self-pay | Admitting: Surgery

## 2020-09-07 ENCOUNTER — Other Ambulatory Visit: Payer: Self-pay

## 2020-09-07 DIAGNOSIS — N63 Unspecified lump in unspecified breast: Secondary | ICD-10-CM

## 2020-09-07 HISTORY — DX: Unspecified lump in unspecified breast: N63.0

## 2020-09-08 NOTE — Progress Notes (Signed)

## 2020-09-18 NOTE — Anesthesia Preprocedure Evaluation (Addendum)
Anesthesia Evaluation  Patient identified by MRN, date of birth, ID band Patient awake    Reviewed: Allergy & Precautions, NPO status , Patient's Chart, lab work & pertinent test results  History of Anesthesia Complications Negative for: history of anesthetic complications  Airway Mallampati: II  TM Distance: >3 FB Neck ROM: Full    Dental no notable dental hx. (+) Dental Advisory Given   Pulmonary neg pulmonary ROS,    Pulmonary exam normal        Cardiovascular negative cardio ROS Normal cardiovascular exam     Neuro/Psych negative neurological ROS     GI/Hepatic negative GI ROS, Neg liver ROS,   Endo/Other  negative endocrine ROS  Renal/GU negative Renal ROS     Musculoskeletal negative musculoskeletal ROS (+)   Abdominal   Peds  Hematology negative hematology ROS (+)   Anesthesia Other Findings   Reproductive/Obstetrics                            Anesthesia Physical Anesthesia Plan  ASA: 1  Anesthesia Plan: General   Post-op Pain Management:    Induction: Intravenous  PONV Risk Score and Plan: 4 or greater and Ondansetron, Dexamethasone, Midazolam and Scopolamine patch - Pre-op  Airway Management Planned: LMA  Additional Equipment:   Intra-op Plan:   Post-operative Plan: Extubation in OR  Informed Consent: I have reviewed the patients History and Physical, chart, labs and discussed the procedure including the risks, benefits and alternatives for the proposed anesthesia with the patient or authorized representative who has indicated his/her understanding and acceptance.     Dental advisory given  Plan Discussed with: Anesthesiologist and CRNA  Anesthesia Plan Comments:        Anesthesia Quick Evaluation

## 2020-09-19 ENCOUNTER — Ambulatory Visit (HOSPITAL_BASED_OUTPATIENT_CLINIC_OR_DEPARTMENT_OTHER): Payer: No Typology Code available for payment source | Admitting: Anesthesiology

## 2020-09-19 ENCOUNTER — Other Ambulatory Visit: Payer: Self-pay

## 2020-09-19 ENCOUNTER — Ambulatory Visit (HOSPITAL_BASED_OUTPATIENT_CLINIC_OR_DEPARTMENT_OTHER)
Admission: RE | Admit: 2020-09-19 | Discharge: 2020-09-19 | Disposition: A | Discharge status: Home / Self Care | Payer: No Typology Code available for payment source | Attending: Surgery | Admitting: Surgery

## 2020-09-19 ENCOUNTER — Encounter (HOSPITAL_BASED_OUTPATIENT_CLINIC_OR_DEPARTMENT_OTHER): Payer: Self-pay | Admitting: Surgery

## 2020-09-19 ENCOUNTER — Encounter (HOSPITAL_BASED_OUTPATIENT_CLINIC_OR_DEPARTMENT_OTHER)
Admission: RE | Disposition: A | Discharge status: Home / Self Care | Payer: Self-pay | Source: Home / Self Care | Attending: Surgery

## 2020-09-19 DIAGNOSIS — Z91040 Latex allergy status: Secondary | ICD-10-CM | POA: Insufficient documentation

## 2020-09-19 DIAGNOSIS — D242 Benign neoplasm of left breast: Secondary | ICD-10-CM | POA: Diagnosis present

## 2020-09-19 HISTORY — PX: BREAST CYST EXCISION: SHX579

## 2020-09-19 LAB — POCT PREGNANCY, URINE: Preg Test, Ur: NEGATIVE

## 2020-09-19 SURGERY — EXCISION, CYST, BREAST
Anesthesia: General | Site: Breast | Laterality: Left

## 2020-09-19 MED ORDER — DEXAMETHASONE SODIUM PHOSPHATE 4 MG/ML IJ SOLN
INTRAMUSCULAR | Status: DC | PRN
  Administered 2020-09-19: 10 mg via INTRAVENOUS

## 2020-09-19 MED ORDER — AMISULPRIDE (ANTIEMETIC) 5 MG/2ML IV SOLN: 10.0000 mg | Freq: Once | INTRAVENOUS | Status: DC | PRN

## 2020-09-19 MED ORDER — ACETAMINOPHEN 500 MG PO TABS
1000.0000 mg | ORAL_TABLET | ORAL | Status: AC
  Administered 2020-09-19: 1000 mg via ORAL

## 2020-09-19 MED ORDER — CEFAZOLIN SODIUM-DEXTROSE 2-4 GM/100ML-% IV SOLN
INTRAVENOUS | Status: AC
  Filled 2020-09-19: qty 100

## 2020-09-19 MED ORDER — OXYCODONE HCL 5 MG PO TABS
ORAL_TABLET | ORAL | Status: AC
  Filled 2020-09-19: qty 1

## 2020-09-19 MED ORDER — FENTANYL CITRATE (PF) 100 MCG/2ML IJ SOLN: 25.0000 ug | INTRAMUSCULAR | Status: DC | PRN

## 2020-09-19 MED ORDER — BUPIVACAINE-EPINEPHRINE 0.25% -1:200000 IJ SOLN
INTRAMUSCULAR | Status: DC | PRN
  Administered 2020-09-19: 10 mL

## 2020-09-19 MED ORDER — MIDAZOLAM HCL 5 MG/5ML IJ SOLN
INTRAMUSCULAR | Status: DC | PRN
Start: 2020-09-19 — End: 2020-09-19
  Administered 2020-09-19: 2 mg via INTRAVENOUS

## 2020-09-19 MED ORDER — PROMETHAZINE HCL 25 MG/ML IJ SOLN: 6.2500 mg | INTRAMUSCULAR | Status: DC | PRN

## 2020-09-19 MED ORDER — DEXAMETHASONE SODIUM PHOSPHATE 10 MG/ML IJ SOLN
INTRAMUSCULAR | Status: AC
  Filled 2020-09-19: qty 1

## 2020-09-19 MED ORDER — ACETAMINOPHEN 500 MG PO TABS: 1000.0000 mg | ORAL_TABLET | Freq: Once | ORAL | Status: DC

## 2020-09-19 MED ORDER — CHLORHEXIDINE GLUCONATE CLOTH 2 % EX PADS: 6.0000 | MEDICATED_PAD | Freq: Once | CUTANEOUS | Status: DC

## 2020-09-19 MED ORDER — LACTATED RINGERS IV SOLN: INTRAVENOUS | Status: DC

## 2020-09-19 MED ORDER — MIDAZOLAM HCL 2 MG/2ML IJ SOLN
INTRAMUSCULAR | Status: AC
  Filled 2020-09-19: qty 2

## 2020-09-19 MED ORDER — LIDOCAINE 2% (20 MG/ML) 5 ML SYRINGE
INTRAMUSCULAR | Status: DC | PRN
  Administered 2020-09-19: 100 mg via INTRAVENOUS

## 2020-09-19 MED ORDER — FENTANYL CITRATE (PF) 100 MCG/2ML IJ SOLN
INTRAMUSCULAR | Status: AC
  Filled 2020-09-19: qty 2

## 2020-09-19 MED ORDER — ONDANSETRON HCL 4 MG/2ML IJ SOLN
INTRAMUSCULAR | Status: AC
  Filled 2020-09-19: qty 2

## 2020-09-19 MED ORDER — PROPOFOL 10 MG/ML IV BOLUS
INTRAVENOUS | Status: DC | PRN
  Administered 2020-09-19: 200 mg via INTRAVENOUS

## 2020-09-19 MED ORDER — SCOPOLAMINE 1 MG/3DAYS TD PT72
MEDICATED_PATCH | TRANSDERMAL | Status: AC
  Filled 2020-09-19: qty 1

## 2020-09-19 MED ORDER — SCOPOLAMINE 1 MG/3DAYS TD PT72
1.0000 | MEDICATED_PATCH | TRANSDERMAL | Status: DC
  Administered 2020-09-19: 1.5 mg via TRANSDERMAL

## 2020-09-19 MED ORDER — CELECOXIB 200 MG PO CAPS
ORAL_CAPSULE | ORAL | Status: AC
  Filled 2020-09-19: qty 1

## 2020-09-19 MED ORDER — ACETAMINOPHEN 500 MG PO TABS
ORAL_TABLET | ORAL | Status: AC
  Filled 2020-09-19: qty 2

## 2020-09-19 MED ORDER — CEFAZOLIN SODIUM-DEXTROSE 2-4 GM/100ML-% IV SOLN
2.0000 g | INTRAVENOUS | Status: AC
  Administered 2020-09-19: 2 g via INTRAVENOUS

## 2020-09-19 MED ORDER — FENTANYL CITRATE (PF) 100 MCG/2ML IJ SOLN
INTRAMUSCULAR | Status: DC | PRN
  Administered 2020-09-19 (×2): 25 ug via INTRAVENOUS
  Administered 2020-09-19: 50 ug via INTRAVENOUS

## 2020-09-19 MED ORDER — ONDANSETRON HCL 4 MG/2ML IJ SOLN
INTRAMUSCULAR | Status: DC | PRN
  Administered 2020-09-19: 4 mg via INTRAVENOUS

## 2020-09-19 MED ORDER — PHENYLEPHRINE HCL (PRESSORS) 10 MG/ML IV SOLN
INTRAVENOUS | Status: DC | PRN
  Administered 2020-09-19 (×2): 40 ug via INTRAVENOUS
  Administered 2020-09-19: 80 ug via INTRAVENOUS

## 2020-09-19 MED ORDER — PROPOFOL 10 MG/ML IV BOLUS
INTRAVENOUS | Status: AC
  Filled 2020-09-19: qty 20

## 2020-09-19 MED ORDER — LIDOCAINE HCL (PF) 2 % IJ SOLN
INTRAMUSCULAR | Status: AC
  Filled 2020-09-19: qty 5

## 2020-09-19 MED ORDER — CELECOXIB 200 MG PO CAPS
200.0000 mg | ORAL_CAPSULE | Freq: Once | ORAL | Status: AC
  Administered 2020-09-19: 200 mg via ORAL

## 2020-09-19 MED ORDER — OXYCODONE HCL 5 MG PO TABS
5.0000 mg | ORAL_TABLET | Freq: Once | ORAL | Status: AC
  Administered 2020-09-19: 5 mg via ORAL

## 2020-09-19 SURGICAL SUPPLY — 39 items
APL PRP STRL LF DISP 70% ISPRP (MISCELLANEOUS) ×1
APL SKNCLS STERI-STRIP NONHPOA (GAUZE/BANDAGES/DRESSINGS) ×1
BENZOIN TINCTURE PRP APPL 2/3 (GAUZE/BANDAGES/DRESSINGS) ×2 IMPLANT
BLADE HEX COATED 2.75 (ELECTRODE) ×2 IMPLANT
BLADE SURG 15 STRL LF DISP TIS (BLADE) ×1 IMPLANT
BLADE SURG 15 STRL SS (BLADE) ×2
CANISTER SUCT 1200ML W/VALVE (MISCELLANEOUS) ×2 IMPLANT
CHLORAPREP W/TINT 26 (MISCELLANEOUS) ×2 IMPLANT
COVER BACK TABLE 60X90IN (DRAPES) ×2 IMPLANT
COVER MAYO STAND STRL (DRAPES) ×2 IMPLANT
DRAPE LAPAROTOMY 100X72 PEDS (DRAPES) ×2 IMPLANT
DRAPE UTILITY XL STRL (DRAPES) ×2 IMPLANT
DRSG TEGADERM 4X4.75 (GAUZE/BANDAGES/DRESSINGS) ×2 IMPLANT
ELECT REM PT RETURN 9FT ADLT (ELECTROSURGICAL) ×2
ELECTRODE REM PT RTRN 9FT ADLT (ELECTROSURGICAL) ×1 IMPLANT
GAUZE SPONGE 4X4 12PLY STRL LF (GAUZE/BANDAGES/DRESSINGS) ×2 IMPLANT
GLOVE SURG POLYISO LF SZ7 (GLOVE) ×4 IMPLANT
GLOVE SURG UNDER POLY LF SZ7 (GLOVE) ×2 IMPLANT
GLOVE SURG UNDER POLY LF SZ7.5 (GLOVE) ×2 IMPLANT
GOWN STRL REUS W/ TWL LRG LVL3 (GOWN DISPOSABLE) ×2 IMPLANT
GOWN STRL REUS W/TWL LRG LVL3 (GOWN DISPOSABLE) ×4
KIT MARKER MARGIN INK (KITS) ×2 IMPLANT
NEEDLE HYPO 25X1 1.5 SAFETY (NEEDLE) ×2 IMPLANT
NS IRRIG 1000ML POUR BTL (IV SOLUTION) ×2 IMPLANT
PACK BASIN DAY SURGERY FS (CUSTOM PROCEDURE TRAY) ×2 IMPLANT
PENCIL SMOKE EVACUATOR (MISCELLANEOUS) ×2 IMPLANT
SLEEVE SCD COMPRESS KNEE MED (STOCKING) ×2 IMPLANT
SPONGE T-LAP 4X18 ~~LOC~~+RFID (SPONGE) ×2 IMPLANT
STRIP CLOSURE SKIN 1/2X4 (GAUZE/BANDAGES/DRESSINGS) ×2 IMPLANT
SUT MON AB 4-0 PC3 18 (SUTURE) ×2 IMPLANT
SUT SILK 2 0 SH (SUTURE) ×2 IMPLANT
SUT VIC AB 3-0 SH 27 (SUTURE) ×2
SUT VIC AB 3-0 SH 27X BRD (SUTURE) ×1 IMPLANT
SYR BULB EAR ULCER 3OZ GRN STR (SYRINGE) ×2 IMPLANT
SYR CONTROL 10ML LL (SYRINGE) ×2 IMPLANT
TOWEL GREEN STERILE FF (TOWEL DISPOSABLE) ×2 IMPLANT
TRAY FAXITRON CT DISP (TRAY / TRAY PROCEDURE) IMPLANT
TUBE CONNECTING 20X1/4 (TUBING) ×2 IMPLANT
YANKAUER SUCT BULB TIP NO VENT (SUCTIONS) ×2 IMPLANT

## 2020-09-19 NOTE — Discharge Instructions (Addendum)
[place] Office Phone Number [phone]  BREAST BIOPSY/ PARTIAL MASTECTOMY: POST OP INSTRUCTIONS  Always review your discharge instruction sheet given to you by the facility where your surgery was performed.  IF YOU HAVE DISABILITY OR FAMILY LEAVE FORMS, YOU MUST BRING THEM TO THE OFFICE FOR PROCESSING.  DO NOT GIVE THEM TO YOUR DOCTOR.  A prescription for pain medication may be given to you upon discharge.  Take your pain medication as prescribed, if needed.  If narcotic pain medicine is not needed, then you may take acetaminophen (Tylenol) or ibuprofen (Advil) as needed. No Tylenol or Ibuprofen until 4:00pm if needed. Take your usually prescribed medications unless otherwise directed If you need a refill on your pain medication, please contact your pharmacy.  They will contact our office to request authorization.  Prescriptions will not be filled after 5pm or on week-ends. You should eat very light the first 24 hours after surgery, such as soup, crackers, pudding, etc.  Resume your normal diet the day after surgery. Most patients will experience some swelling and bruising in the breast.  Ice packs and a good support bra will help.  Swelling and bruising can take several days to resolve.  It is common to experience some constipation if taking pain medication after surgery.  Increasing fluid intake and taking a stool softener will usually help or prevent this problem from occurring.  A mild laxative (Milk of Magnesia or Miralax) should be taken according to package directions if there are no bowel movements after 48 hours. Unless discharge instructions indicate otherwise, you may remove your bandages 24-48 hours after surgery, and you may shower at that time.  You may have steri-strips (small skin tapes) in place directly over the incision.  These strips should be left on the skin for 7-10 days.  If your surgeon used skin glue on the incision, you may shower in 24 hours.  The glue  will flake off over the next 2-3 weeks.  Any sutures or staples will be removed at the office during your follow-up visit. ACTIVITIES:  You may resume regular daily activities (gradually increasing) beginning the next day.  Wearing a good support bra or sports bra minimizes pain and swelling.  You may have sexual intercourse when it is comfortable. You may drive when you no longer are taking prescription pain medication, you can comfortably wear a seatbelt, and you can safely maneuver your car and apply brakes. RETURN TO WORK:  ______________________________________________________________________________________ Dennis Bast should see your doctor in the office for a follow-up appointment approximately two weeks after your surgery.  Your doctor's nurse will typically make your follow-up appointment when she calls you with your pathology report.  Expect your pathology report 2-3 business days after your surgery.  You may call to check if you do not hear from [place] after three days. OTHER INSTRUCTIONS: _______________________________________________________________________________________________ _____________________________________________________________________________________________________________________________________ _____________________________________________________________________________________________________________________________________ _____________________________________________________________________________________________________________________________________  WHEN TO CALL YOUR DOCTOR: Fever over 101.0 Nausea and/or vomiting. Extreme swelling or bruising. Continued bleeding from incision. Increased pain, redness, or drainage from the incision.  The clinic staff is available to answer your questions during regular business hours.  Please don't hesitate to call and ask to speak to one of the nurses for clinical concerns.  If you have a medical emergency, go to the nearest emergency  room or call 911.  A surgeon from [facility] is always on call at the hospital.  For further questions, please visit [email or web address]    Post Anesthesia Home Care Instructions  Activity:  Get plenty of rest for the remainder of the day. A responsible individual must stay with you for 24 hours following the procedure.  For the next 24 hours, DO NOT: -Drive a car -Paediatric nurse -Drink alcoholic beverages -Take any medication unless instructed by your physician -Make any legal decisions or sign important papers.  Meals: Start with liquid foods such as gelatin or soup. Progress to regular foods as tolerated. Avoid greasy, spicy, heavy foods. If nausea and/or vomiting occur, drink only clear liquids until the nausea and/or vomiting subsides. Call your physician if vomiting continues.  Special Instructions/Symptoms: Your throat may feel dry or sore from the anesthesia or the breathing tube placed in your throat during surgery. If this causes discomfort, gargle with warm salt water. The discomfort should disappear within 24 hours.  If you had a scopolamine patch placed behind your ear for the management of post- operative nausea and/or vomiting:  1. The medication in the patch is effective for 72 hours, after which it should be removed.  Wrap patch in a tissue and discard in the trash. Wash hands thoroughly with soap and water. 2. You may remove the patch earlier than 72 hours if you experience unpleasant side effects which may include dry mouth, dizziness or visual disturbances. 3. Avoid touching the patch. Wash your hands with soap and water after contact with the patch.

## 2020-09-19 NOTE — Anesthesia Procedure Notes (Signed)
Procedure Name: LMA Insertion Date/Time: 09/19/2020 11:07 AM Performed by: Ezequiel Kayser, CRNA Pre-anesthesia Checklist: Patient identified, Emergency Drugs available, Suction available and Patient being monitored Patient Re-evaluated:Patient Re-evaluated prior to induction Oxygen Delivery Method: Circle System Utilized Preoxygenation: Pre-oxygenation with 100% oxygen Induction Type: IV induction Ventilation: Mask ventilation without difficulty LMA: LMA inserted LMA Size: 4.0 Number of attempts: 1 Airway Equipment and Method: Bite block Placement Confirmation: positive ETCO2 Tube secured with: Tape Dental Injury: Teeth and Oropharynx as per pre-operative assessment

## 2020-09-19 NOTE — H&P (Signed)
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  Recently she underwent mammogram as well as ultrasound.  In the left breast at 2:00, 7 cm from the nipple there is a oval circumscribed mass that is enlarging.  It is now 3 cm in greatest diameter.  The right breast shows a 1.4 cm mass at 131 cm from the nipple that has not changed in the last 2 years.  The left breast shows a collection of smaller hypoechoic masses at 1:00 2:00 and 7:00 that are relatively unchanged.     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 No drug use  Tobacco use  Never smoker.   Family History  First Degree Relatives  No pertinent family history    Pregnancy / Birth History  Age at menarche  57 years. Age of menopause  <45 Gravida  0 Para  0 Regular periods    Other Problems  Thyroid Disease          Review of Systems  General Not Present- Appetite Loss, Chills, Fatigue, Fever, Night Sweats, Weight Gain and Weight Loss. Skin Not Present- Change in Wart/Mole, Dryness, Hives, Jaundice, New Lesions, Non-Healing Wounds, Rash and Ulcer. HEENT Present- Wears glasses/contact lenses. Not Present- Earache, Hearing Loss, Hoarseness, Nose Bleed, Oral Ulcers, Ringing in the Ears, Seasonal Allergies, Sinus Pain, Sore Throat, Visual Disturbances and Yellow Eyes. Respiratory Not Present- Bloody sputum, Chronic Cough, Difficulty Breathing, Snoring and Wheezing. Female Genitourinary Not Present- Frequency, Nocturia, Painful  Urination, Pelvic Pain and Urgency. Hematology Not Present- Blood Thinners, Easy Bruising, Excessive bleeding, Gland problems, HIV and Persistent Infections.   Vitals  Weight: 187 lb   Height: 64 in  Body Surface Area: 1.9 m   Body Mass Index: 32.1 kg/m   Temp.: 98.4 F    Pulse: 86 (Regular)    BP: 118/84(Sitting, Left Arm, Standard)               Physical Exam  The physical exam findings are as follows: Note: Constitutional: WDWN in NAD, conversant, no obvious deformities; resting comfortably Eyes: Pupils equal, round; sclera anicteric; moist conjunctiva; no lid lag HENT: Oral mucosa moist; good dentition Neck: No masses palpated, trachea midline; no thyromegaly Lungs: CTA bilaterally; normal respiratory effort Breasts: Symmetrical, no nipple changes, right breast shows no palpable masses or lymphadenopathy. The left breast shows a palpable 3 cm mass in the upper outer quadrant at 2:00. This is located about 6 cm from the center of the nipple. It is mobile. There are no overlying skin changes. No axillary lymphadenopathy. 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  The surgical procedure has been discussed with the patient.  Potential risks, benefits, alternative treatments, and expected outcomes have been explained.  All of the patient's questions at this time have been answered.  The likelihood of reaching the patient's treatment goal is good.  The patient understand the proposed surgical procedure and  wishes to proceed.  Imogene Burn. Georgette Dover, MD, [facility]  General Surgery   09/19/2020 7:26 AM

## 2020-09-19 NOTE — Interval H&P Note (Signed)
History and Physical Interval Note:  09/19/2020 10:55 AM  Cheryl Aguilar  has presented today for surgery, with the diagnosis of LEFT BREAST FIBROADENOMA.  The various methods of treatment have been discussed with the patient and family. After consideration of risks, benefits and other options for treatment, the patient has consented to  Procedure(s): EXCISION OF LEFT BREAST FIBROADENOMA (Left) as a surgical intervention.  The patient's history has been reviewed, patient examined, no change in status, stable for surgery.  I have reviewed the patient's chart and labs.  Questions were answered to the patient's satisfaction.     Maia Petties

## 2020-09-19 NOTE — Op Note (Signed)
Pre-op Diagnosis:  Left breast fibroadenoma Post-op Diagnosis: same Procedure:  Excision of left breast fibroadenoma Surgeon:  Maia Petties. 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  Recently she underwent mammogram as well as ultrasound.  In the left breast at 2:00, 7 cm from the nipple there is a oval circumscribed mass that is enlarging.  It is now 3 cm in greatest diameter.  Description of procedure: The patient is brought to the operating room placed in supine position on the operating room table. After an adequate level of general anesthesia was obtained, her left breast was prepped with ChloraPrep and draped in sterile fashion. A timeout was taken to ensure the proper patient and proper procedure. We made a circumareolar incision around the lateral side of the nipple after infiltrating with 0.25% Marcaine. Dissection was carried down in the breast tissue with cautery. The fibroadenoma is easily palpable.  We moved this towards the incision until we could completely visualize the mass.The specimen was removed and was oriented with a paint kit.  This was sent for pathologic examination. We inspected carefully for hemostasis. The wound was thoroughly irrigated. The wound was closed with a deep layer of 3-0 Vicryl and a subcuticular layer of 4-0 Monocryl. Benzoin Steri-Strips were applied. The patient was then extubated and brought to the recovery room in stable condition. All sponge, instrument, and needle counts are correct.  Imogene Burn. Georgette Dover, MD, [facility]  General/ Trauma Surgery  09/19/2020 11:51 AM

## 2020-09-19 NOTE — Anesthesia Postprocedure Evaluation (Signed)
Anesthesia Post Note  Patient: Cheryl Aguilar  Procedure(s) Performed: EXCISION OF LEFT BREAST FIBROADENOMA (Left: Breast)     Patient location during evaluation: PACU Anesthesia Type: General Level of consciousness: sedated Pain management: pain level controlled Vital Signs Assessment: post-procedure vital signs reviewed and stable Respiratory status: spontaneous breathing and respiratory function stable Cardiovascular status: stable Postop Assessment: no apparent nausea or vomiting Anesthetic complications: no   No notable events documented.  Last Vitals:  Vitals:   09/19/20 1230 09/19/20 1243  BP: 115/76 106/77  Pulse: 63 71  Resp: 18 17  Temp:  36.7 C  SpO2: 99% 98%    Last Pain:  Vitals:   09/19/20 1243  TempSrc:   PainSc: 3                  Julias Mould DANIEL

## 2020-09-19 NOTE — Transfer of Care (Signed)
Immediate Anesthesia Transfer of Care Note  Patient: Cheryl Aguilar  Procedure(s) Performed: EXCISION OF LEFT BREAST FIBROADENOMA (Left: Breast)  Patient Location: PACU  Anesthesia Type:General  Level of Consciousness: drowsy  Airway & Oxygen Therapy: Patient Spontanous Breathing and Patient connected to face mask oxygen  Post-op Assessment: Report given to RN and Post -op Vital signs reviewed and stable  Post vital signs: Reviewed and stable  Last Vitals:  Vitals Value Taken Time  BP 115/59 09/19/20 1157  Temp    Pulse 61 09/19/20 1158  Resp 15 09/19/20 1158  SpO2 100 % 09/19/20 1158  Vitals shown include unvalidated device data.  Last Pain:  Vitals:   09/19/20 0954  TempSrc: Oral  PainSc: 4       Patients Stated Pain Goal: 5 ([id number])  Complications: No notable events documented.

## 2020-09-20 ENCOUNTER — Encounter (HOSPITAL_BASED_OUTPATIENT_CLINIC_OR_DEPARTMENT_OTHER): Payer: Self-pay | Admitting: Surgery

## 2020-09-20 LAB — SURGICAL PATHOLOGY

## 2022-01-14 ENCOUNTER — Other Ambulatory Visit (HOSPITAL_COMMUNITY): Payer: Self-pay | Admitting: Family Medicine

## 2022-01-14 DIAGNOSIS — R102 Pelvic and perineal pain: Secondary | ICD-10-CM

## 2022-01-16 ENCOUNTER — Ambulatory Visit (HOSPITAL_COMMUNITY)
Admission: RE | Admit: 2022-01-16 | Discharge: 2022-01-16 | Disposition: A | Discharge status: Home / Self Care | Payer: No Typology Code available for payment source | Source: Ambulatory Visit | Attending: Family Medicine | Admitting: Family Medicine

## 2022-01-16 DIAGNOSIS — R102 Pelvic and perineal pain: Secondary | ICD-10-CM | POA: Diagnosis present

## 2022-01-17 ENCOUNTER — Ambulatory Visit (HOSPITAL_COMMUNITY): Payer: No Typology Code available for payment source

## 2022-01-22 ENCOUNTER — Encounter: Payer: Self-pay | Admitting: Internal Medicine

## 2022-03-06 ENCOUNTER — Ambulatory Visit (INDEPENDENT_AMBULATORY_CARE_PROVIDER_SITE_OTHER): Payer: No Typology Code available for payment source | Admitting: Gastroenterology

## 2022-03-06 ENCOUNTER — Encounter: Payer: Self-pay | Admitting: Gastroenterology

## 2022-03-06 VITALS — BP 104/71 | HR 83 | Temp 98.3°F | Ht 64.0 in | Wt 186.4 lb

## 2022-03-06 DIAGNOSIS — K5900 Constipation, unspecified: Secondary | ICD-10-CM

## 2022-03-06 DIAGNOSIS — K219 Gastro-esophageal reflux disease without esophagitis: Secondary | ICD-10-CM

## 2022-03-06 NOTE — Patient Instructions (Signed)
Start pantoprazole '40mg'$  daily before breakfast for acid reflux. For constipation, start miralax 17 grams (one capful) twice daily until soft stool then once daily. Return to the office in 8 weeks.

## 2022-03-06 NOTE — Progress Notes (Signed)
GI Office Note    Referring Provider: Jake Samples, PA* Primary Care Physician:  Scherrie Bateman  Primary Gastroenterologist: Garfield Cornea, MD   Chief Complaint   Chief Complaint  Patient presents with   Constipation    States that she has issues with using the bathroom, also states that she had imaging done that showed fluid or something in her stomach.     History of Present Illness   Cheryl Aguilar is a 37 y.o. female presenting today at the request of Delman Cheadle, PA-C for further evaluation of abdominal pain.  Patient states she recently had a pelvic ultrasound for right lower quadrant pain that she has had for some time.  She thought her pain might be due to gynecological cause because she had been having some painful periods.  She had also been having some irregularity and spotting for a few months.  She was given birth control pills to try to regulate that she states she only took 2 of them and decided not to continue.  Last menstrual cycle has been normal so far.  She is thinking about seeing gynecology, plans to call [facility].  She wonders if her pain is related to constipation.  She had similar symptoms in 2017 when we saw her then.  She is having 2-3 stools daily but stools are hard, not productive.  She feels like her stools are incomplete.  Strains.  No melena or rectal bleeding.  She also notes that certain foods cause her stomach to burn.  Especially tomato-based foods.  She tries to avoid food triggers but she finds that she is taking Tums on a daily basis.  She feels like she is taking way too many TUMS and gets only minimal improvement. No weight loss. No dysphagia. No vomiting.  Pelvic U/S 01/2022: small free fluid in pelvis. Otherwise negative pelvic u/s.   Medications   No current outpatient medications on file.   No current facility-administered medications for this visit.    Allergies   Allergies as of 03/06/2022 - Review  Complete 03/06/2022  Allergen Reaction Noted   Latex Hives and Itching 09/19/2020     Past Medical History   Past Medical History:  Diagnosis Date   Breast lump 09/07/2020    Past Surgical History   Past Surgical History:  Procedure Laterality Date   BREAST CYST EXCISION Left 09/19/2020   Procedure: EXCISION OF LEFT BREAST FIBROADENOMA;  Surgeon: Donnie Mesa, MD;  Location: [place];  Service: General;  Laterality: Left;    Past Family History   Family History  Problem Relation Age of Onset   Diabetes Mother    Diabetes Brother    Esophageal cancer Paternal Grandfather    Colon cancer Neg Hx     Past Social History   Social History   Socioeconomic History   Marital status: Married    Spouse name: Not on file   Number of children: Not on file   Years of education: Not on file   Highest education level: Not on file  Occupational History   Not on file  Tobacco Use   Smoking status: Never   Smokeless tobacco: Never  Vaping Use   Vaping Use: Never used  Substance and Sexual Activity   Alcohol use: No   Drug use: No   Sexual activity: Yes    Partners: Male    Comment: husband had vasectomy 2014  Other Topics Concern   Not on  file  Social History Narrative   Not on file   Social Determinants of Health   Financial Resource Strain: Not on file  Food Insecurity: Not on file  Transportation Needs: Not on file  Physical Activity: Not on file  Stress: Not on file  Social Connections: Not on file  Intimate Partner Violence: Not on file    Review of Systems   General: Negative for anorexia, weight loss, fever, chills, fatigue, weakness. ENT: Negative for hoarseness, difficulty swallowing , nasal congestion. CV: Negative for chest pain, angina, palpitations, dyspnea on exertion, peripheral edema.  Respiratory: Negative for dyspnea at rest, dyspnea on exertion, cough, sputum, wheezing.  GI: See history of present illness. GU:  Negative for  dysuria, hematuria, urinary incontinence, urinary frequency, nocturnal urination. See hpi Endo: Negative for unusual weight change.     Physical Exam   BP 104/71 (BP Location: Right Arm, Patient Position: Sitting, Cuff Size: Large)   Pulse 83   Temp 98.3 F (36.8 C) (Oral)   Ht '5\' 4"'$  (1.626 m)   Wt 186 lb 6.4 oz (84.6 kg)   LMP 02/24/2022 (Approximate)   SpO2 97%   BMI 32.00 kg/m    General: Well-nourished, well-developed in no acute distress.  Eyes: No icterus. Mouth: Oropharyngeal mucosa moist and pink , no lesions erythema or exudate. Lungs: Clear to auscultation bilaterally.  Heart: Regular rate and rhythm, no murmurs rubs or gallops.  Abdomen: Bowel sounds are normal,   nondistended, no hepatosplenomegaly or masses,  no abdominal bruits or hernia , no rebound or guarding. Mild epig tenderness. Rectal: not performed Extremities: No lower extremity edema. No clubbing or deformities. Neuro: Alert and oriented x 4   Skin: Warm and dry, no jaundice.   Psych: Alert and cooperative, normal mood and affect.  Labs   Labs from January 11, 2022: Glucose 97, BUN 8, creatinine 0.52, total bilirubin less than 0.2, alkaline phosphatase 98, AST 15, ALT 16, albumin 4.3, white blood cell count 9300, hemoglobin 12.4, platelets 346,000, TSH 4.380  Imaging Studies   No results found.  Assessment   GERD/epigastric pain: daily heartburn using significant amounts of TUMS. She tries to avoid food triggers as much as possible. Seems more foods are causing symptoms. Will start daily PPI to get symptoms under control.   Constipation: may be contributing to her lower abdominal pain. Recent pelvic u/s unrevealing. Small amount of free pelvic fluid, likely insignificant. She plans to establish care with gyn. Doubt appendicitis based on chronicity of symptoms. Will treat constipation, if persistent abdominal pain she will require further work up.    PLAN   Start pantoprazole '40mg'$  daily before  breakfast. Start miralax 17 grams bid until soft stool, then continue once daily. Reinforced antireflux measures.  Return ov in 8 weeks.    Laureen Ochs. Bobby Rumpf, [place], [facility]

## 2022-03-11 ENCOUNTER — Other Ambulatory Visit: Payer: Self-pay

## 2022-03-11 MED ORDER — PANTOPRAZOLE SODIUM 40 MG PO TBEC: 40.0000 mg | DELAYED_RELEASE_TABLET | Freq: Every day | ORAL | 0 refills | Status: DC

## 2022-04-16 ENCOUNTER — Encounter: Payer: Self-pay | Admitting: Orthopaedic Surgery

## 2022-04-16 ENCOUNTER — Ambulatory Visit (INDEPENDENT_AMBULATORY_CARE_PROVIDER_SITE_OTHER): Payer: No Typology Code available for payment source

## 2022-04-16 ENCOUNTER — Ambulatory Visit (INDEPENDENT_AMBULATORY_CARE_PROVIDER_SITE_OTHER): Payer: No Typology Code available for payment source | Admitting: Orthopaedic Surgery

## 2022-04-16 VITALS — BP 126/72 | HR 77 | Ht 64.0 in | Wt 184.0 lb

## 2022-04-16 DIAGNOSIS — F32A Depression, unspecified: Secondary | ICD-10-CM | POA: Insufficient documentation

## 2022-04-16 DIAGNOSIS — G8929 Other chronic pain: Secondary | ICD-10-CM

## 2022-04-16 DIAGNOSIS — M5441 Lumbago with sciatica, right side: Secondary | ICD-10-CM

## 2022-04-16 DIAGNOSIS — E559 Vitamin D deficiency, unspecified: Secondary | ICD-10-CM | POA: Insufficient documentation

## 2022-04-16 DIAGNOSIS — R454 Irritability and anger: Secondary | ICD-10-CM | POA: Insufficient documentation

## 2022-04-16 DIAGNOSIS — E7800 Pure hypercholesterolemia, unspecified: Secondary | ICD-10-CM | POA: Insufficient documentation

## 2022-04-16 DIAGNOSIS — R6882 Decreased libido: Secondary | ICD-10-CM | POA: Insufficient documentation

## 2022-04-16 DIAGNOSIS — R5382 Chronic fatigue, unspecified: Secondary | ICD-10-CM | POA: Insufficient documentation

## 2022-04-16 DIAGNOSIS — M2550 Pain in unspecified joint: Secondary | ICD-10-CM | POA: Insufficient documentation

## 2022-04-16 DIAGNOSIS — E611 Iron deficiency: Secondary | ICD-10-CM | POA: Insufficient documentation

## 2022-04-16 DIAGNOSIS — F419 Anxiety disorder, unspecified: Secondary | ICD-10-CM | POA: Insufficient documentation

## 2022-04-16 MED ORDER — HYDROCODONE-ACETAMINOPHEN 5-325 MG PO TABS: ORAL_TABLET | ORAL | 0 refills | Status: DC

## 2022-04-16 MED ORDER — PREDNISONE 5 MG (21) PO TBPK: ORAL_TABLET | ORAL | 0 refills | Status: DC

## 2022-04-16 MED ORDER — CYCLOBENZAPRINE HCL 10 MG PO TABS: 10.0000 mg | ORAL_TABLET | Freq: Every day | ORAL | 0 refills | Status: DC

## 2022-04-16 NOTE — Progress Notes (Signed)
Subjective:    Patient ID: Cheryl Aguilar, female    DOB: 02-Dec-1985, 37 y.o.   MRN: [id number]  HPI She has had thigh pain on the right and leg pain below the knee for about two months, getting worse.  The pain does not run to the foot.  It is painful in the lower back and hip on the right at times.  She has tried rest, heat, ice and Tylenol with no help.  She has no trauma.  She has no numbness.  She has no weakness.   Review of Systems  Constitutional:  Positive for activity change.  Musculoskeletal:  Positive for arthralgias, back pain and myalgias.  All other systems reviewed and are negative. For Review of Systems, all other systems reviewed and are negative.  The following is a summary of the past history medically, past history surgically, known current medicines, social history and family history.  This information is gathered electronically by the computer from prior information and documentation.  I review this each visit and have found including this information at this point in the chart is beneficial and informative.   Past Medical History:  Diagnosis Date   Breast lump 09/07/2020    Past Surgical History:  Procedure Laterality Date   BREAST CYST EXCISION Left 09/19/2020   Procedure: EXCISION OF LEFT BREAST FIBROADENOMA;  Surgeon: Donnie Mesa, MD;  Location: [place];  Service: General;  Laterality: Left;    Current Outpatient Medications on File Prior to Visit  Medication Sig Dispense Refill   levothyroxine (SYNTHROID) 25 MCG tablet Take 1 tablet by mouth daily.     pantoprazole (PROTONIX) 40 MG tablet Take 1 tablet (40 mg total) by mouth daily. 90 tablet 0   SPRINTEC 28 0.25-35 MG-MCG tablet Take 1 tablet by mouth daily.     No current facility-administered medications on file prior to visit.    Social History   Socioeconomic History   Marital status: Married    Spouse name: Not on file   Number of children: Not on file   Years of  education: Not on file   Highest education level: Not on file  Occupational History   Not on file  Tobacco Use   Smoking status: Never   Smokeless tobacco: Never  Vaping Use   Vaping Use: Never used  Substance and Sexual Activity   Alcohol use: No   Drug use: No   Sexual activity: Yes    Partners: Male    Comment: husband had vasectomy 2014  Other Topics Concern   Not on file  Social History Narrative   Not on file   Social Determinants of Health   Financial Resource Strain: Not on file  Food Insecurity: Not on file  Transportation Needs: Not on file  Physical Activity: Not on file  Stress: Not on file  Social Connections: Not on file  Intimate Partner Violence: Not on file    Family History  Problem Relation Age of Onset   Diabetes Mother    Diabetes Brother    Esophageal cancer Paternal Grandfather    Colon cancer Neg Hx    Crohn's disease Neg Hx    Celiac disease Neg Hx     BP 126/72   Pulse 77   Ht '5\' 4"'$  (1.626 m)   Wt 184 lb (83.5 kg)   BMI 31.58 kg/m   Body mass index is 31.58 kg/m.      Objective:   Physical Exam Vitals and nursing  note reviewed. Exam conducted with a chaperone present.  Constitutional:      Appearance: She is well-developed.  HENT:     Head: Normocephalic and atraumatic.  Eyes:     Conjunctiva/sclera: Conjunctivae normal.     Pupils: Pupils are equal, round, and reactive to light.  Cardiovascular:     Rate and Rhythm: Normal rate and regular rhythm.  Pulmonary:     Effort: Pulmonary effort is normal.  Abdominal:     Palpations: Abdomen is soft.  Musculoskeletal:       Arms:     Cervical back: Normal range of motion and neck supple.  Skin:    General: Skin is warm and dry.  Neurological:     Mental Status: She is alert and oriented to person, place, and time.     Cranial Nerves: No cranial nerve deficit.     Motor: No abnormal muscle tone.     Coordination: Coordination normal.     Deep Tendon Reflexes: Reflexes  are normal and symmetric. Reflexes normal.  Psychiatric:        Behavior: Behavior normal.        Thought Content: Thought content normal.        Judgment: Judgment normal.   X-rays were done of the lumbar spine, reported separately.        Assessment & Plan:   Encounter Diagnosis  Name Primary?   Chronic right-sided low back pain with right-sided sciatica Yes   I have explained the findings to her. I have shown the X-rays to her.  I will begin Flexeril, prednisone dose pack and pain medicine.  She cannot take NSAIDs.  I have reviewed the [place] web site prior to prescribing narcotic medicine for this patient.  Return in two weeks.  Begin PT.  Home exercises given.  Call if any problem.  Precautions discussed.  Electronically Signed Sanjuana Kava, MD 3/5/20243:55 PM

## 2022-04-16 NOTE — Patient Instructions (Addendum)
 YOU HAVE BEEN PRESCRIBED ORAL PREDNISONE  DAY 1: TAKE 6 TABLETS BY MOUTH, TAKE THEM ALL AT ONE TIME DAY 2: TAKE 5 TABLETS BY MOUTH, TAKE THEM ALL AT ONE TIME DAY 3: TAKE 4 TABLETS BY MOUTH, TAKE THEM ALL AT ONE TIME DAY 4: TAKE 3 TABLETS BY MOUTH, TAKE THEM ALL AT ONE TIME DAY 5: TAKE 2 TABLETS BY MOUTH, TAKE THEM ALL AT ONE TIME DAY 6: TAKE 1 TABLET BY MOUTH  You have been prescribed a muscle relaxer for the muscle spasms in your neck. This will cause drowsiness so do not drive or operate heavy machinery until you know how you will react to the medication. Take it when you are ready to go to sleep.   You've been referred for Physical Therapy   [facility] Address: [address] [place], [place] [address] Phone: [phone]   DR.KEELING'S SCHEDULE IS AS FOLLOWS: TUESDAY: ALL DAY WEDNESDAY: MORNING ONLY THURSDAY: MORNING ONLY PLEASE CALL OR SEND A MESSAGE VIA MYCHART BY WEDNESDAY MORNING SO THAT I CAN SEND A REQUEST TO HIM. HE LEAVES THURSDAY BY 11:30AM. HE DOES NOT RETURN TO THE OFFICE UNTIL TUESDAY MORNINGS AND HE DOES NOT CHECK HIS WORK MESSAGES DURING HIS TIME AWAY FROM THE OFFICE. I'M  AND IF YOU NEED SOMETHING, SEND ME A MYCHART MESSAGE OR CALL. MYCHART IS THE FASTEST WAY TO GET IN CONTACT WITH ME.   Do the lower back exercises 2-3 times a day every day.

## 2022-04-18 ENCOUNTER — Ambulatory Visit: Payer: No Typology Code available for payment source | Admitting: Orthopedic Surgery

## 2022-04-25 ENCOUNTER — Telehealth: Payer: Self-pay

## 2022-04-25 NOTE — Telephone Encounter (Signed)
Spoke with patient regarding her message. She states she started taking the prednisone on 04/17/22 and finished it on 04/23/22. The pain and soreness began ~04/21/22. She stated that since finishing the prednisone the pain has lessened. Soreness is still there but improving daily. Would like to know if this could be a side effect of the Prednisone. Also patient has her PT Eval appt today at [facility]

## 2022-04-26 NOTE — Telephone Encounter (Signed)
Spoke with patient and advised her that she should begin to feel better as each day passes. She stated that she has felt better the last couple of days. Advised patient to keep PT appts.

## 2022-04-30 ENCOUNTER — Ambulatory Visit: Payer: No Typology Code available for payment source | Admitting: Orthopaedic Surgery

## 2022-04-30 ENCOUNTER — Encounter: Payer: Self-pay | Admitting: Gastroenterology

## 2022-04-30 ENCOUNTER — Ambulatory Visit (INDEPENDENT_AMBULATORY_CARE_PROVIDER_SITE_OTHER): Payer: No Typology Code available for payment source | Admitting: Gastroenterology

## 2022-04-30 VITALS — BP 116/61 | HR 86 | Temp 97.4°F | Ht 64.0 in | Wt 190.4 lb

## 2022-04-30 DIAGNOSIS — K5900 Constipation, unspecified: Secondary | ICD-10-CM

## 2022-04-30 DIAGNOSIS — K219 Gastro-esophageal reflux disease without esophagitis: Secondary | ICD-10-CM

## 2022-04-30 MED ORDER — PANTOPRAZOLE SODIUM 40 MG PO TBEC: 40.0000 mg | DELAYED_RELEASE_TABLET | Freq: Every day | ORAL | 3 refills | Status: DC

## 2022-04-30 NOTE — Patient Instructions (Signed)
Continue pantoprazole 40 mg daily before breakfast. Continue MiraLAX 17 g daily as needed to maintain soft bowel movements. We will see back in 1 year, call sooner if you have any questions or concerns.

## 2022-04-30 NOTE — Progress Notes (Signed)
GI Office Note    Referring Provider: Jake Samples, PA* Primary Care Physician:  Scherrie Bateman  Primary Gastroenterologist: Garfield Cornea, MD   Chief Complaint   Chief Complaint  Patient presents with   Follow-up    History of Present Illness   Cheryl Aguilar is a 37 y.o. female presenting today for follow-up.  Last seen January 2024.    She has a history of lower abdominal pain, constipation, epigastric pain. Completed pelvic ultrasound December 2023 with small free fluid in the pelvis but otherwise negative.  Started pantoprazole 40 mg daily at last office visit.  Also started MiraLAX 17 g twice daily until soft stool, then once daily.  Labs from December 2023: BUN 8, creatinine 0.52, albumin 4.3, total bilirubin less than 0.2, alkaline phosphatase 98, AST 15, ALT 16, white blood cell count 9300, hemoglobin 12.4, platelets 346,000, TSH 4.38.  Since her last office visit she was diagnosed with chronic right-sided low back pain with right-sided sciatica.  She received prednisone Dosepak, Flexeril, hydrocodone/APAP.  Started in physical therapy.  With regards to abdominal pain, belching, bloating, reflux, all of her symptoms have improved since starting pantoprazole.  She is also having softer stools with MiraLAX, typically taking 2-3 times per week.  Denies any melena or rectal bleeding.   Medications   Current Outpatient Medications  Medication Sig Dispense Refill   cyclobenzaprine (FLEXERIL) 10 MG tablet Take 1 tablet (10 mg total) by mouth at bedtime. One tablet every night at bedtime as needed for spasm. 30 tablet 0   HYDROcodone-acetaminophen (NORCO/VICODIN) 5-325 MG tablet Take 1 tablet by mouth every 6 (six) hours as needed for moderate pain.     pantoprazole (PROTONIX) 40 MG tablet Take 1 tablet (40 mg total) by mouth daily before breakfast. 90 tablet 3   No current facility-administered medications for this visit.    Allergies   Allergies as  of 04/30/2022 - Review Complete 04/30/2022  Allergen Reaction Noted   Latex Hives and Itching 09/19/2020   Review of Systems   General: Negative for anorexia, weight loss, fever, chills, fatigue, weakness. ENT: Negative for hoarseness, difficulty swallowing , nasal congestion. CV: Negative for chest pain, angina, palpitations, dyspnea on exertion, peripheral edema.  Respiratory: Negative for dyspnea at rest, dyspnea on exertion, cough, sputum, wheezing.  GI: See history of present illness. GU:  Negative for dysuria, hematuria, urinary incontinence, urinary frequency, nocturnal urination.  Endo: Negative for unusual weight change.     Physical Exam   BP 116/61 (BP Location: Right Arm, Patient Position: Sitting, Cuff Size: Normal)   Pulse 86   Temp (!) 97.4 F (36.3 C) (Temporal)   Ht 5\' 4"  (1.626 m)   Wt 190 lb 6.4 oz (86.4 kg)   LMP 04/22/2022   SpO2 93%   BMI 32.68 kg/m    General: Well-nourished, well-developed in no acute distress.  Eyes: No icterus. Mouth: Oropharyngeal mucosa moist and pink bdomen: Bowel sounds are normal, nontender, nondistended, no hepatosplenomegaly or masses,  no abdominal bruits or hernia , no rebound or guarding.  Rectal: Not performed Extremities: No lower extremity edema. No clubbing or deformities. Neuro: Alert and oriented x 4   Skin: Warm and dry, no jaundice.   Psych: Alert and cooperative, normal mood and affect.  Labs   see HPI Imaging Studies   DG Lumbar Spine Complete  Result Date: 04/16/2022 Clinical:  pain to right thigh and lower leg paresthesias, no trauma X-rays were done of  the lumbar spine, five views. There is slight loss of normal lumbar lordosis.  Alignment otherwise normal of vertebrae.  No fracture is noted. Bone quality is good. Impression:  slight loss of normal lumbar lordosis.  No acute findings. Electronically [place], MD 3/5/20243:45 PM   Assessment   GERD/epigastric pain: Doing well on pantoprazole.   Will continue for now.  Discussed that in the future we will try to discontinue PPI therapy however given current medication regimen for her back, would recommend ongoing treatment at this time.  Constipation: Doing better.   PLAN   Continue pantoprazole 40 mg daily before breakfast. Continue MiraLAX 17 g daily as needed. Return office visit in 1 year or sooner if needed.   Laureen Ochs. Bobby Rumpf, [place], [facility]

## 2022-05-08 ENCOUNTER — Ambulatory Visit (INDEPENDENT_AMBULATORY_CARE_PROVIDER_SITE_OTHER): Payer: No Typology Code available for payment source | Admitting: Orthopaedic Surgery

## 2022-05-08 ENCOUNTER — Encounter: Payer: Self-pay | Admitting: Orthopaedic Surgery

## 2022-05-08 VITALS — BP 127/83 | HR 85 | Ht 64.0 in | Wt 184.0 lb

## 2022-05-08 DIAGNOSIS — G8929 Other chronic pain: Secondary | ICD-10-CM | POA: Diagnosis not present

## 2022-05-08 DIAGNOSIS — M5441 Lumbago with sciatica, right side: Secondary | ICD-10-CM

## 2022-05-08 NOTE — Patient Instructions (Signed)
Follow up: 3 weeks  Keep going to PT. If the exercises they're asking you to do is painful, let them know!!

## 2022-05-08 NOTE — Progress Notes (Signed)
I am better.  She has been to PT and is improving.  She has less pain on the right side and right thigh but it is still there at times.  She is doing her exercises at home.  I have reviewed the PT notes.  Spine/Pelvis examination:  Inspection:  Overall, sacoiliac joint benign and hips nontender; without crepitus or defects.   Thoracic spine inspection: Alignment normal without kyphosis present   Lumbar spine inspection:  Alignment  with normal lumbar lordosis, without scoliosis apparent.   Thoracic spine palpation:  without tenderness of spinal processes   Lumbar spine palpation: without tenderness of lumbar area; without tightness of lumbar muscles    Range of Motion:   Lumbar flexion, forward flexion is normal without pain or tenderness    Lumbar extension is full without pain or tenderness   Left lateral bend is normal without pain or tenderness   Right lateral bend is normal without pain or tenderness   Straight leg raising is normal  Strength & tone: normal   Stability overall normal stability  Encounter Diagnosis  Name Primary?   Chronic right-sided low back pain with right-sided sciatica Yes   Return in three weeks.  Continue PT.  Call if any problem.  Precautions discussed.  Electronically Signed Sanjuana Kava, MD 3/27/20248:48 AM

## 2022-05-09 ENCOUNTER — Ambulatory Visit: Payer: No Typology Code available for payment source | Admitting: Orthopaedic Surgery

## 2022-05-29 ENCOUNTER — Ambulatory Visit (INDEPENDENT_AMBULATORY_CARE_PROVIDER_SITE_OTHER): Payer: No Typology Code available for payment source | Admitting: Orthopaedic Surgery

## 2022-05-29 ENCOUNTER — Encounter: Payer: Self-pay | Admitting: Orthopaedic Surgery

## 2022-05-29 VITALS — BP 112/64 | HR 78 | Ht 64.0 in | Wt 184.0 lb

## 2022-05-29 DIAGNOSIS — G8929 Other chronic pain: Secondary | ICD-10-CM | POA: Diagnosis not present

## 2022-05-29 DIAGNOSIS — M5441 Lumbago with sciatica, right side: Secondary | ICD-10-CM | POA: Diagnosis not present

## 2022-05-29 MED ORDER — CYCLOBENZAPRINE HCL 10 MG PO TABS: 10.0000 mg | ORAL_TABLET | Freq: Every day | ORAL | 0 refills | Status: DC

## 2022-05-29 NOTE — Progress Notes (Signed)
I have had more pain the last few days.  She is going to PT and is making slow progress but she has had more pain on the right side the last few days.  She is doing her exercises and taking her Flexeril.  She has no weakness, no new trauma.  Spine/Pelvis examination:  Inspection:  Overall, sacoiliac joint benign and hips nontender; without crepitus or defects.   Thoracic spine inspection: Alignment normal without kyphosis present   Lumbar spine inspection:  Alignment  with normal lumbar lordosis, without scoliosis apparent.   Thoracic spine palpation:  without tenderness of spinal processes   Lumbar spine palpation: without tenderness of lumbar area; without tightness of lumbar muscles    Range of Motion:   Lumbar flexion, forward flexion is normal without pain or tenderness    Lumbar extension is full without pain or tenderness   Left lateral bend is normal without pain or tenderness   Right lateral bend is normal without pain or tenderness   Straight leg raising is normal  Strength & tone: normal   Stability overall normal stability  Encounter Diagnosis  Name Primary?   Chronic right-sided low back pain with right-sided sciatica Yes   Continue PT.  I will refill Flexeril.  Return in three weeks.  If not improved, consider MRI of lumbar spine.  Call if any problem.  Precautions discussed.  Electronically Signed Darreld Mclean, MD 4/17/20248:24 AM

## 2022-05-29 NOTE — Patient Instructions (Signed)
Follow up: 3 weeks LBP

## 2022-06-19 ENCOUNTER — Ambulatory Visit (INDEPENDENT_AMBULATORY_CARE_PROVIDER_SITE_OTHER): Payer: No Typology Code available for payment source | Admitting: Orthopaedic Surgery

## 2022-06-19 ENCOUNTER — Encounter: Payer: Self-pay | Admitting: Orthopaedic Surgery

## 2022-06-19 VITALS — BP 128/86 | HR 86 | Ht 64.0 in | Wt 184.0 lb

## 2022-06-19 DIAGNOSIS — M5441 Lumbago with sciatica, right side: Secondary | ICD-10-CM

## 2022-06-19 DIAGNOSIS — G8929 Other chronic pain: Secondary | ICD-10-CM

## 2022-06-19 NOTE — Patient Instructions (Signed)
Finish the physical therapy

## 2022-06-19 NOTE — Progress Notes (Signed)
I am better.  She has been going to PT and is slowly improving.  She has less back pain and less pain in the thigh on the right.  I have reviewed the PT notes.  Spine/Pelvis examination:  Inspection:  Overall, sacoiliac joint benign and hips nontender; without crepitus or defects.   Thoracic spine inspection: Alignment normal without kyphosis present   Lumbar spine inspection:  Alignment  with normal lumbar lordosis, without scoliosis apparent.   Thoracic spine palpation:  without tenderness of spinal processes   Lumbar spine palpation: without tenderness of lumbar area; without tightness of lumbar muscles    Range of Motion:   Lumbar flexion, forward flexion is normal without pain or tenderness    Lumbar extension is full without pain or tenderness   Left lateral bend is normal without pain or tenderness   Right lateral bend is normal without pain or tenderness   Straight leg raising is normal  Strength & tone: normal   Stability overall normal stability  Encounter Diagnosis  Name Primary?   Chronic right-sided low back pain with right-sided sciatica Yes   Continue PT.  Return in three weeks.  I will hold on MRI for now.  Call if any problem.  Precautions discussed.  Electronically Signed Darreld Mclean, MD 5/8/20248:48 AM

## 2022-07-10 ENCOUNTER — Ambulatory Visit (INDEPENDENT_AMBULATORY_CARE_PROVIDER_SITE_OTHER): Payer: No Typology Code available for payment source | Admitting: Orthopaedic Surgery

## 2022-07-10 ENCOUNTER — Encounter: Payer: Self-pay | Admitting: Orthopaedic Surgery

## 2022-07-10 VITALS — BP 131/78 | HR 88 | Ht 64.0 in | Wt 184.0 lb

## 2022-07-10 DIAGNOSIS — M5441 Lumbago with sciatica, right side: Secondary | ICD-10-CM | POA: Diagnosis not present

## 2022-07-10 DIAGNOSIS — G8929 Other chronic pain: Secondary | ICD-10-CM | POA: Diagnosis not present

## 2022-07-10 MED ORDER — HYDROCODONE-ACETAMINOPHEN 5-325 MG PO TABS: 1.0000 | ORAL_TABLET | Freq: Four times a day (QID) | ORAL | 0 refills | Status: DC | PRN

## 2022-07-10 NOTE — Progress Notes (Signed)
My back is hurting more.  She has been to PT and has one more appointment.  It helps a little but she has had more right sided back pain and right paresthesias to the right foot.  She is uncomfortable most of the time.  She has done her exercises.  She has no new trauma.    Spine/Pelvis examination:  Inspection:  Overall, sacoiliac joint benign and hips nontender; without crepitus or defects.   Thoracic spine inspection: Alignment normal without kyphosis present   Lumbar spine inspection:  Alignment  with normal lumbar lordosis, without scoliosis apparent.   Thoracic spine palpation:  without tenderness of spinal processes   Lumbar spine palpation: without tenderness of lumbar area; without tightness of lumbar muscles    Range of Motion:   Lumbar flexion, forward flexion is normal without pain or tenderness    Lumbar extension is full without pain or tenderness   Left lateral bend is normal without pain or tenderness   Right lateral bend is normal without pain or tenderness   Straight leg raising is normal  Strength & tone: normal   Stability overall normal stability  Encounter Diagnosis  Name Primary?   Chronic right-sided low back pain with right-sided sciatica Yes   I have reviewed the [place] Controlled Substance Reporting System web site prior to prescribing narcotic medicine for this patient.  I will get MRI as she has not improved with time, meds and PT.  Return in two weeks.  Call if any problem.  Precautions discussed.  Electronically Signed Darreld Mclean, MD 5/29/20248:07 AM

## 2022-07-12 ENCOUNTER — Telehealth: Payer: Self-pay | Admitting: Orthopaedic Surgery

## 2022-07-12 NOTE — Telephone Encounter (Signed)
DR Hilda Lias Patient needs something for her MRI she is claustrophobic.  Pharmacy: [facility] in [place]

## 2022-07-15 ENCOUNTER — Telehealth: Payer: Self-pay | Admitting: Orthopaedic Surgery

## 2022-07-15 ENCOUNTER — Other Ambulatory Visit: Payer: Self-pay | Admitting: Orthopedic Surgery

## 2022-07-15 DIAGNOSIS — F40240 Claustrophobia: Secondary | ICD-10-CM

## 2022-07-15 MED ORDER — DIAZEPAM 5 MG PO TABS: 5.0000 mg | ORAL_TABLET | Freq: Four times a day (QID) | ORAL | 0 refills | Status: AC | PRN

## 2022-07-15 NOTE — Progress Notes (Signed)
Requested Prescriptions   Signed Prescriptions Disp Refills   diazepam (VALIUM) 5 MG tablet 1 tablet 0    Sig: Take 1 tablet (5 mg total) by mouth every 6 (six) hours as needed for anxiety. Take before 30 min before mri / do not drive while taking   For mri claustrophobia

## 2022-07-15 NOTE — Telephone Encounter (Signed)
Dr. Sanjuan Dame pt - pt lvm stating that she is scheduled for her MRI tomorrow and that she had requested something to take before the MRI because she is claustrophobic.  She wants to know if this is something they'll give her at the MRI or if we call it in because her pharmacy hasn't received anything yet.

## 2022-07-16 ENCOUNTER — Ambulatory Visit (HOSPITAL_COMMUNITY)
Admission: RE | Admit: 2022-07-16 | Discharge: 2022-07-16 | Disposition: A | Discharge status: Home / Self Care | Payer: No Typology Code available for payment source | Source: Ambulatory Visit | Attending: Orthopaedic Surgery | Admitting: Orthopaedic Surgery

## 2022-07-16 NOTE — Progress Notes (Signed)
Pt had appointment today however, her insurance is still pending.   Patient to call when she gets the Pre Auth number to [facility]

## 2022-07-18 NOTE — Telephone Encounter (Signed)
Cheryl Aguilar has picked her medication up for MRI

## 2022-07-24 ENCOUNTER — Ambulatory Visit: Payer: No Typology Code available for payment source | Admitting: Orthopaedic Surgery

## 2022-07-28 ENCOUNTER — Ambulatory Visit (HOSPITAL_COMMUNITY): Payer: No Typology Code available for payment source

## 2022-08-03 ENCOUNTER — Ambulatory Visit (HOSPITAL_COMMUNITY)
Admission: RE | Admit: 2022-08-03 | Discharge: 2022-08-03 | Disposition: A | Discharge status: Home / Self Care | Payer: No Typology Code available for payment source | Source: Ambulatory Visit | Attending: Orthopaedic Surgery | Admitting: Orthopaedic Surgery

## 2022-08-03 DIAGNOSIS — M5441 Lumbago with sciatica, right side: Secondary | ICD-10-CM | POA: Insufficient documentation

## 2022-08-03 DIAGNOSIS — G8929 Other chronic pain: Secondary | ICD-10-CM | POA: Insufficient documentation

## 2022-08-20 ENCOUNTER — Encounter: Payer: Self-pay | Admitting: Orthopaedic Surgery

## 2022-08-20 ENCOUNTER — Ambulatory Visit (INDEPENDENT_AMBULATORY_CARE_PROVIDER_SITE_OTHER): Payer: No Typology Code available for payment source | Admitting: Orthopaedic Surgery

## 2022-08-20 VITALS — BP 128/78 | HR 87 | Ht 64.0 in | Wt 188.0 lb

## 2022-08-20 DIAGNOSIS — M5441 Lumbago with sciatica, right side: Secondary | ICD-10-CM

## 2022-08-20 DIAGNOSIS — G8929 Other chronic pain: Secondary | ICD-10-CM | POA: Diagnosis not present

## 2022-08-20 MED ORDER — HYDROCODONE-ACETAMINOPHEN 5-325 MG PO TABS: 1.0000 | ORAL_TABLET | Freq: Four times a day (QID) | ORAL | 0 refills | Status: DC | PRN

## 2022-08-20 NOTE — Progress Notes (Signed)
My back hurts.  She has pain in the lower back at times, some good days and some bad days.  She has no new trauma, no weakness.  She had MRI of the lumbar spine showing: IMPRESSION: 1. No evidence of fracture or subluxation. 2. Mild left lateral recess stenosis at L3-L4. 3. Moderate left lateral recess stenosis at L4-L5. 4. Moderate bilateral lateral recess stenosis at L5-S1.  I have explained the findings to her.  I have recommended epidural and will arrange for her to be seen at Dr. Inavale Blas office.  I have independently reviewed the MRI.     Spine/Pelvis examination:  Inspection:  Overall, sacoiliac joint benign and hips nontender; without crepitus or defects.   Thoracic spine inspection: Alignment normal without kyphosis present   Lumbar spine inspection:  Alignment  with normal lumbar lordosis, without scoliosis apparent.   Thoracic spine palpation:  without tenderness of spinal processes   Lumbar spine palpation: without tenderness of lumbar area; without tightness of lumbar muscles    Range of Motion:   Lumbar flexion, forward flexion is normal without pain or tenderness    Lumbar extension is full without pain or tenderness   Left lateral bend is normal without pain or tenderness   Right lateral bend is normal without pain or tenderness   Straight leg raising is normal  Strength & tone: normal   Stability overall normal stability  Encounter Diagnosis  Name Primary?   Chronic right-sided low back pain with right-sided sciatica Yes   To see Dr. Alvester Morin.  I have reviewed the [place] Controlled Substance Reporting System web site prior to prescribing narcotic medicine for this patient.  I have refilled her pain medicine.  Continue her exercises.  Return in one month.  Call if any problem.  Precautions discussed.  Electronically Signed Darreld Mclean, MD 7/9/20242:37 PM

## 2022-08-22 ENCOUNTER — Telehealth: Payer: Self-pay | Admitting: Orthopaedic Surgery

## 2022-08-22 NOTE — Telephone Encounter (Signed)
Dr. Sanjuan Dame pt - pt called, stated she doesn't remember what Dr. Hilda Lias said about her MRI, she would like a call.

## 2022-08-26 NOTE — Telephone Encounter (Signed)
Spoke to patient and informed her that we referred her to Dr Alvester Morin for possible injections in the back  She sated she was just wondering and had forgotten what was said at the visit  I will call dr newtons office to see what we can do

## 2022-09-10 ENCOUNTER — Ambulatory Visit (INDEPENDENT_AMBULATORY_CARE_PROVIDER_SITE_OTHER): Payer: No Typology Code available for payment source | Admitting: Physical Medicine and Rehabilitation

## 2022-09-10 ENCOUNTER — Other Ambulatory Visit: Payer: Self-pay

## 2022-09-10 VITALS — BP 115/70 | HR 97

## 2022-09-10 DIAGNOSIS — M5416 Radiculopathy, lumbar region: Secondary | ICD-10-CM | POA: Diagnosis not present

## 2022-09-10 MED ORDER — METHYLPREDNISOLONE ACETATE 80 MG/ML IJ SUSP
80.0000 mg | Freq: Once | INTRAMUSCULAR | Status: AC
  Administered 2022-09-10: 80 mg

## 2022-09-10 NOTE — Patient Instructions (Signed)

## 2022-09-10 NOTE — Progress Notes (Unsigned)
Functional Pain Scale - descriptive words and definitions  Distressing (6)    Pain is present/unable to complete most ADLs limited by pain/sleep is difficult and active distraction is only marginal. Moderate range order  Average Pain 6-7   +Driver, -BT, -Dye Allergies.  Lower back pain on the right side that radiates into right leg. Sitting or standing too long makes pain worse

## 2022-09-12 NOTE — Progress Notes (Signed)
Cheryl Aguilar - 37 y.o. female MRN [id number]  Date of birth: [birth year]  Office Visit Note: Visit Date: 09/10/2022 PCP: Avis Epley, PA-C Referred by: Avis Epley, PA*  Subjective: Chief Complaint  Patient presents with   Lower Back - Pain   HPI:  Cheryl Aguilar is a 37 y.o. female who comes in today at the request of Dr. Darreld Mclean for planned Right L5-S1 Lumbar Interlaminar epidural steroid injection with fluoroscopic guidance.  The patient has failed conservative care including home exercise, medications, time and activity modification.  This injection will be diagnostic and hopefully therapeutic.  Please see requesting physician notes for further details and justification.   ROS Otherwise per HPI.  Assessment & Plan: Visit Diagnoses:    ICD-10-CM   1. Lumbar radiculopathy  M54.16 XR C-ARM NO REPORT    Epidural Steroid injection    methylPREDNISolone acetate (DEPO-MEDROL) injection 80 mg      Plan: No additional findings.   Meds & Orders:  Meds ordered this encounter  Medications   methylPREDNISolone acetate (DEPO-MEDROL) injection 80 mg    Orders Placed This Encounter  Procedures   XR C-ARM NO REPORT   Epidural Steroid injection    Follow-up: Return for visit to requesting provider as needed.   Procedures: No procedures performed  Lumbar Epidural Steroid Injection - Interlaminar Approach with Fluoroscopic Guidance  Patient: Cheryl Aguilar      Date of Birth: 1985/04/20 MRN: [id number] PCP: Avis Epley, PA-C      Visit Date: 09/10/2022   Universal Protocol:     Consent Given By: the patient  Position: PRONE  Additional Comments: Vital signs were monitored before and after the procedure. Patient was prepped and draped in the usual sterile fashion. The correct patient, procedure, and site was verified.   Injection Procedure Details:   Procedure diagnoses: Lumbar radiculopathy [M54.16]   Meds Administered:  Meds ordered  this encounter  Medications   methylPREDNISolone acetate (DEPO-MEDROL) injection 80 mg     Laterality: Right  Location/Site:  L5-S1  Needle: 3.5 in., 20 ga. Tuohy  Needle Placement: Paramedian epidural  Findings:   -Comments: Excellent flow of contrast into the epidural space.  Procedure Details: Using a paramedian approach from the side mentioned above, the region overlying the inferior lamina was localized under fluoroscopic visualization and the soft tissues overlying this structure were infiltrated with 4 ml. of 1% Lidocaine without Epinephrine. The Tuohy needle was inserted into the epidural space using a paramedian approach.   The epidural space was localized using loss of resistance along with counter oblique bi-planar fluoroscopic views.  After negative aspirate for air, blood, and CSF, a 2 ml. volume of Isovue-250 was injected into the epidural space and the flow of contrast was observed. Radiographs were obtained for documentation purposes.    The injectate was administered into the level noted above.   Additional Comments:  No complications occurred Dressing: 2 x 2 sterile gauze and Band-Aid    Post-procedure details: Patient was observed during the procedure. Post-procedure instructions were reviewed.  Patient left the clinic in stable condition.   Clinical History: MRI LUMBAR SPINE WITHOUT CONTRAST   TECHNIQUE: Multiplanar, multisequence MR imaging of the lumbar spine was performed. No intravenous contrast was administered.   COMPARISON:  Radiographs dated April 16, 2022   FINDINGS: Segmentation:   5 non rib-bearing lumbar type vertebral bodies are present. The lowest fully formed vertebral body is L5.   Alignment:  Straightening of the lumbar spine  Vertebrae:  No fracture, evidence of discitis, or bone lesion.   Conus medullaris and cauda equina: Conus extends to the L2 level. Conus and cauda equina appear normal.   Paraspinal and other soft  tissues: Negative.   Disc levels:   T12-L1: No significant disc bulge. No neural foraminal stenosis. No central canal stenosis.   L1-L2: No significant disc bulge. No neural foraminal stenosis. No central canal stenosis.   L2-L3: No significant disc bulge. No neural foraminal stenosis. No central canal stenosis.   L3-L4: Mild left paracentral disc bulge with mild left lateral recess stenosis. No significant neural foraminal stenosis.   L4-L5: Disc desiccation and broad-based disc bulge with moderate left lateral recess stenosis. No significant neural foraminal stenosis.   L5-S1: Disc desiccation and disc height loss with broad-based disc bulge. Moderate bilateral lateral recess stenosis. No significant neural foraminal stenosis. Moderate bilateral facet joint arthropathy.   IMPRESSION: 1. No evidence of fracture or subluxation. 2. Mild left lateral recess stenosis at L3-L4. 3. Moderate left lateral recess stenosis at L4-L5. 4. Moderate bilateral lateral recess stenosis at L5-S1.     Electronically Signed   By: Larose Hires D.O.   On: 08/07/2022 23:57     Objective:  VS:  HT:    WT:   BMI:     BP:115/70  HR:97bpm  TEMP: ( )  RESP:  Physical Exam Vitals and nursing note reviewed.  Constitutional:      General: She is not in acute distress.    Appearance: Normal appearance. She is not ill-appearing.  HENT:     Head: Normocephalic and atraumatic.     Right Ear: External ear normal.     Left Ear: External ear normal.  Eyes:     Extraocular Movements: Extraocular movements intact.  Cardiovascular:     Rate and Rhythm: Normal rate.     Pulses: Normal pulses.  Pulmonary:     Effort: Pulmonary effort is normal. No respiratory distress.  Abdominal:     General: There is no distension.     Palpations: Abdomen is soft.  Musculoskeletal:        General: Tenderness present.     Cervical back: Neck supple.     Right lower leg: No edema.     Left lower leg: No  edema.     Comments: Patient has good distal strength with no pain over the greater trochanters.  No clonus or focal weakness.  Skin:    Findings: No erythema, lesion or rash.  Neurological:     General: No focal deficit present.     Mental Status: She is alert and oriented to person, place, and time.     Sensory: No sensory deficit.     Motor: No weakness or abnormal muscle tone.     Coordination: Coordination normal.  Psychiatric:        Mood and Affect: Mood normal.        Behavior: Behavior normal.      Imaging: No results found.

## 2022-09-12 NOTE — Procedures (Signed)
Lumbar Epidural Steroid Injection - Interlaminar Approach with Fluoroscopic Guidance  Patient: Cheryl Aguilar      Date of Birth: 06-16-1985 MRN: [id number] PCP: Avis Epley, PA-C      Visit Date: 09/10/2022   Universal Protocol:     Consent Given By: the patient  Position: PRONE  Additional Comments: Vital signs were monitored before and after the procedure. Patient was prepped and draped in the usual sterile fashion. The correct patient, procedure, and site was verified.   Injection Procedure Details:   Procedure diagnoses: Lumbar radiculopathy [M54.16]   Meds Administered:  Meds ordered this encounter  Medications   methylPREDNISolone acetate (DEPO-MEDROL) injection 80 mg     Laterality: Right  Location/Site:  L5-S1  Needle: 3.5 in., 20 ga. Tuohy  Needle Placement: Paramedian epidural  Findings:   -Comments: Excellent flow of contrast into the epidural space.  Procedure Details: Using a paramedian approach from the side mentioned above, the region overlying the inferior lamina was localized under fluoroscopic visualization and the soft tissues overlying this structure were infiltrated with 4 ml. of 1% Lidocaine without Epinephrine. The Tuohy needle was inserted into the epidural space using a paramedian approach.   The epidural space was localized using loss of resistance along with counter oblique bi-planar fluoroscopic views.  After negative aspirate for air, blood, and CSF, a 2 ml. volume of Isovue-250 was injected into the epidural space and the flow of contrast was observed. Radiographs were obtained for documentation purposes.    The injectate was administered into the level noted above.   Additional Comments:  No complications occurred Dressing: 2 x 2 sterile gauze and Band-Aid    Post-procedure details: Patient was observed during the procedure. Post-procedure instructions were reviewed.  Patient left the clinic in stable condition.

## 2022-09-17 ENCOUNTER — Encounter: Payer: Self-pay | Admitting: Orthopaedic Surgery

## 2022-09-17 ENCOUNTER — Ambulatory Visit (INDEPENDENT_AMBULATORY_CARE_PROVIDER_SITE_OTHER): Payer: No Typology Code available for payment source | Admitting: Orthopaedic Surgery

## 2022-09-17 VITALS — BP 117/81 | HR 76 | Ht 64.0 in | Wt 187.8 lb

## 2022-09-17 DIAGNOSIS — M5416 Radiculopathy, lumbar region: Secondary | ICD-10-CM

## 2022-09-17 MED ORDER — CYCLOBENZAPRINE HCL 10 MG PO TABS: 10.0000 mg | ORAL_TABLET | Freq: Every day | ORAL | 0 refills | Status: DC

## 2022-09-17 MED ORDER — HYDROCODONE-ACETAMINOPHEN 5-325 MG PO TABS: 1.0000 | ORAL_TABLET | Freq: Four times a day (QID) | ORAL | 0 refills | Status: DC | PRN

## 2022-09-17 NOTE — Progress Notes (Signed)
I am a little better.  She had her epidural injection last week.  She has relief but not fully.  I told her this is usual.  She should improve.  I have reviewed Dr. [place] Blas notes.  Lower back is not that tender today. ROM is good.  NV intact.  Toe/heel gait normal.  Encounter Diagnosis  Name Primary?   Lumbar radiculopathy Yes   I have refilled her pain medicine and Flexeril.  I have reviewed the [place] Controlled Substance Reporting System web site prior to prescribing narcotic medicine for this patient.  Return in six weeks.  Call if any problem.  Precautions discussed.  Electronically Signed Darreld Mclean, MD 8/6/20243:19 PM

## 2022-10-29 ENCOUNTER — Ambulatory Visit (INDEPENDENT_AMBULATORY_CARE_PROVIDER_SITE_OTHER): Payer: No Typology Code available for payment source | Admitting: Orthopaedic Surgery

## 2022-10-29 ENCOUNTER — Encounter: Payer: Self-pay | Admitting: Orthopaedic Surgery

## 2022-10-29 VITALS — BP 110/65 | HR 83 | Ht 64.0 in | Wt 187.0 lb

## 2022-10-29 DIAGNOSIS — M5416 Radiculopathy, lumbar region: Secondary | ICD-10-CM | POA: Diagnosis not present

## 2022-10-29 NOTE — Progress Notes (Signed)
I am better.  She has some lower back pain but is much improved.  She has minimal pain today.  Spine/Pelvis examination:  Inspection:  Overall, sacoiliac joint benign and hips nontender; without crepitus or defects.   Thoracic spine inspection: Alignment normal without kyphosis present   Lumbar spine inspection:  Alignment  with normal lumbar lordosis, without scoliosis apparent.   Thoracic spine palpation:  without tenderness of spinal processes   Lumbar spine palpation: without tenderness of lumbar area; without tightness of lumbar muscles    Range of Motion:   Lumbar flexion, forward flexion is normal without pain or tenderness    Lumbar extension is full without pain or tenderness   Left lateral bend is normal without pain or tenderness   Right lateral bend is normal without pain or tenderness   Straight leg raising is normal  Strength & tone: normal   Stability overall normal stability  Encounter Diagnosis  Name Primary?   Lumbar radiculopathy Yes   I will see prn.  Call if any problem.  Precautions discussed.  Electronically Signed Darreld Mclean, MD 9/17/20242:30 PM

## 2022-12-01 ENCOUNTER — Other Ambulatory Visit: Payer: Self-pay

## 2022-12-01 ENCOUNTER — Ambulatory Visit
Admission: EM | Admit: 2022-12-01 | Discharge: 2022-12-01 | Disposition: A | Discharge status: Home / Self Care | Payer: No Typology Code available for payment source | Attending: Family Medicine | Admitting: Family Medicine

## 2022-12-01 ENCOUNTER — Encounter: Payer: Self-pay | Admitting: Emergency Medicine

## 2022-12-01 DIAGNOSIS — J069 Acute upper respiratory infection, unspecified: Secondary | ICD-10-CM

## 2022-12-01 DIAGNOSIS — Z1152 Encounter for screening for COVID-19: Secondary | ICD-10-CM | POA: Diagnosis not present

## 2022-12-01 MED ORDER — PROMETHAZINE-DM 6.25-15 MG/5ML PO SYRP: 5.0000 mL | ORAL_SOLUTION | Freq: Four times a day (QID) | ORAL | 0 refills | Status: DC | PRN

## 2022-12-01 NOTE — Discharge Instructions (Signed)
COVID results should be back tomorrow and someone will call if positive to discuss antiviral medications.  Have sent over a good cough syrup and you may continue taking DayQuil, NyQuil, Mucinex, Flonase and other supportive remedies.

## 2022-12-01 NOTE — ED Triage Notes (Signed)
Pt reports cough since Wednesday. Pt reports it hurts to cough, intermittent emesis, nausea. Has been taking otc medication and reports cough persists.

## 2022-12-02 LAB — SARS CORONAVIRUS 2 (TAT 6-24 HRS): SARS Coronavirus 2: NEGATIVE

## 2022-12-05 NOTE — ED Provider Notes (Signed)
[facility]    CSN: [id number] Arrival date & time: 12/01/22  1338      History   Chief Complaint Chief Complaint  Patient presents with   Cough    HPI Cheryl Aguilar is a 37 y.o. female.   Patient presenting today with 5-day history of productive cough, intermittent nausea and vomiting denies chest pain, shortness of breath, abdominal pain, diarrhea, fever, chills, body aches.  So far trying over-the-counter cough syrup with minimal relief.  No known history of chronic pulmonary disease.  Does have a history of GERD and constipation, takes Protonix daily as needed.  LMP 11/28/2022.    Past Medical History:  Diagnosis Date   Breast lump 09/07/2020    Patient Active Problem List   Diagnosis Date Noted   Anxiety 04/16/2022   Arthralgia 04/16/2022   Chronic fatigue 04/16/2022   Depression 04/16/2022   Elevated LDL cholesterol level 04/16/2022   Iron deficiency 04/16/2022   Irritability 04/16/2022   Reduced libido 04/16/2022   Vitamin D deficiency 04/16/2022   Constipation 03/06/2022   GERD (gastroesophageal reflux disease) 03/06/2022   Hypothyroidism 02/02/2019   Early satiety 01/18/2016   Lower abdominal pain 01/18/2016   Bowel habit changes 01/18/2016   Dysphagia 01/18/2016    Past Surgical History:  Procedure Laterality Date   BREAST CYST EXCISION Left 09/19/2020   Procedure: EXCISION OF LEFT BREAST FIBROADENOMA;  Surgeon: Manus Rudd, MD;  Location: [facility];  Service: General;  Laterality: Left;    OB History   No obstetric history on file.      Home Medications    Prior to Admission medications   Medication Sig Start Date End Date Taking? Authorizing Provider  promethazine-dextromethorphan (PROMETHAZINE-DM) 6.25-15 MG/5ML syrup Take 5 mLs by mouth 4 (four) times daily as needed. 12/01/22  Yes Particia Nearing, PA-C  cyclobenzaprine (FLEXERIL) 10 MG tablet Take 1 tablet (10 mg total) by mouth at bedtime. One  tablet every night at bedtime as needed for spasm. 09/17/22   Darreld Mclean, MD  diazepam (VALIUM) 5 MG tablet Take 1 tablet (5 mg total) by mouth every 6 (six) hours as needed for anxiety. Take before 30 min before mri / do not drive while taking 4/0/98   Vickki Hearing, MD  HYDROcodone-acetaminophen (NORCO/VICODIN) 5-325 MG tablet Take 1 tablet by mouth every 6 (six) hours as needed for moderate pain. 09/17/22   Darreld Mclean, MD  pantoprazole (PROTONIX) 40 MG tablet Take 1 tablet (40 mg total) by mouth daily before breakfast. 04/30/22   Tiffany Kocher, PA-C  SPRINTEC 28 0.25-35 MG-MCG tablet Take 1 tablet by mouth daily. 07/02/22   [provider]    Family History Family History  Problem Relation Age of Onset   Diabetes Mother    Diabetes Brother    Esophageal cancer Paternal Grandfather    Colon cancer Neg Hx    Crohn's disease Neg Hx    Celiac disease Neg Hx     Social History Social History   Tobacco Use   Smoking status: Never   Smokeless tobacco: Never  Vaping Use   Vaping status: Never Used  Substance Use Topics   Alcohol use: No   Drug use: No     Allergies   Latex   Review of Systems Review of Systems Per HPI  Physical Exam Triage Vital Signs ED Triage Vitals  Encounter Vitals Group     BP 12/01/22 1435 137/77     Systolic BP Percentile --  Diastolic BP Percentile --      Pulse Rate 12/01/22 1435 88     Resp 12/01/22 1435 20     Temp 12/01/22 1435 98 F (36.7 C)     Temp Source 12/01/22 1435 Oral     SpO2 12/01/22 1435 96 %     Weight --      Height --      Head Circumference --      Peak Flow --      Pain Score 12/01/22 1433 5     Pain Loc --      Pain Education --      Exclude from Growth Chart --    No data found.  Updated Vital Signs BP 137/77 (BP Location: Right Arm)   Pulse 88   Temp 98 F (36.7 C) (Oral)   Resp 20   LMP 11/28/2022 (Approximate)   SpO2 96%   Visual Acuity Right Eye Distance:   Left Eye  Distance:   Bilateral Distance:    Right Eye Near:   Left Eye Near:    Bilateral Near:     Physical Exam Vitals and nursing note reviewed.  Constitutional:      Appearance: Normal appearance. She is not ill-appearing.  HENT:     Head: Atraumatic.     Nose: Nose normal.     Mouth/Throat:     Mouth: Mucous membranes are moist.     Pharynx: Oropharynx is clear. No posterior oropharyngeal erythema.  Eyes:     Extraocular Movements: Extraocular movements intact.     Conjunctiva/sclera: Conjunctivae normal.  Cardiovascular:     Rate and Rhythm: Normal rate and regular rhythm.     Heart sounds: Normal heart sounds.  Pulmonary:     Effort: Pulmonary effort is normal.     Breath sounds: Normal breath sounds. No wheezing or rales.  Abdominal:     General: Bowel sounds are normal. There is no distension.     Palpations: Abdomen is soft.     Tenderness: There is no abdominal tenderness. There is no right CVA tenderness, left CVA tenderness or guarding.  Musculoskeletal:        General: Normal range of motion.     Cervical back: Normal range of motion and neck supple.  Skin:    General: Skin is warm and dry.  Neurological:     Mental Status: She is alert and oriented to person, place, and time.  Psychiatric:        Mood and Affect: Mood normal.        Thought Content: Thought content normal.        Judgment: Judgment normal.      UC Treatments / Results  Labs (all labs ordered are listed, but only abnormal results are displayed) Labs Reviewed  SARS CORONAVIRUS 2 (TAT 6-24 HRS)    EKG   Radiology No results found.  Procedures Procedures (including critical care time)  Medications Ordered in UC Medications - No data to display  Initial Impression / Assessment and Plan / UC Course  I have reviewed the triage vital signs and the nursing notes.  Pertinent labs & imaging results that were available during my care of the patient were reviewed by me and considered in my  medical decision making (see chart for details).     Vital signs and exam very reassuring today, suspicious for viral etiology.  COVID testing pending, good candidate for Paxlovid if positive.  Treat with Phenergan DM, supportive over-the-counter  medications, home care, good hydration.  Return for worsening symptoms.  Final Clinical Impressions(s) / UC Diagnoses   Final diagnoses:  Viral URI with cough     Discharge Instructions      COVID results should be back tomorrow and someone will call if positive to discuss antiviral medications.  Have sent over a good cough syrup and you may continue taking DayQuil, NyQuil, Mucinex, Flonase and other supportive remedies.    ED Prescriptions     Medication Sig Dispense Auth. Provider   promethazine-dextromethorphan (PROMETHAZINE-DM) 6.25-15 MG/5ML syrup Take 5 mLs by mouth 4 (four) times daily as needed. 100 mL Particia Nearing, [place]      PDMP not reviewed this encounter.   Roosvelt Maser [place], [place] 12/05/22 [id number]

## 2022-12-19 ENCOUNTER — Other Ambulatory Visit: Payer: Self-pay | Admitting: Orthopaedic Surgery

## 2022-12-19 MED ORDER — HYDROCODONE-ACETAMINOPHEN 5-325 MG PO TABS: 1.0000 | ORAL_TABLET | Freq: Four times a day (QID) | ORAL | 0 refills | Status: DC | PRN

## 2023-03-21 ENCOUNTER — Telehealth: Payer: Self-pay | Admitting: Orthopaedic Surgery

## 2023-03-21 ENCOUNTER — Telehealth: Payer: Self-pay | Admitting: Orthopedic Surgery

## 2023-03-25 MED ORDER — CYCLOBENZAPRINE HCL 10 MG PO TABS: 10.0000 mg | ORAL_TABLET | Freq: Every day | ORAL | 0 refills | Status: AC

## 2023-03-25 MED ORDER — HYDROCODONE-ACETAMINOPHEN 5-325 MG PO TABS: 1.0000 | ORAL_TABLET | Freq: Four times a day (QID) | ORAL | 0 refills | Status: AC | PRN

## 2023-04-15 ENCOUNTER — Encounter: Payer: Self-pay | Admitting: Internal Medicine

## 2023-05-28 ENCOUNTER — Encounter: Payer: Self-pay | Admitting: Gastroenterology

## 2023-05-28 ENCOUNTER — Ambulatory Visit (INDEPENDENT_AMBULATORY_CARE_PROVIDER_SITE_OTHER): Admitting: Gastroenterology

## 2023-05-28 VITALS — BP 102/57 | HR 89 | Temp 98.1°F | Ht 64.0 in | Wt 189.6 lb

## 2023-05-28 DIAGNOSIS — K219 Gastro-esophageal reflux disease without esophagitis: Secondary | ICD-10-CM | POA: Diagnosis not present

## 2023-05-28 MED ORDER — RABEPRAZOLE SODIUM 20 MG PO TBEC: 20.0000 mg | DELAYED_RELEASE_TABLET | Freq: Every day | ORAL | 3 refills | Status: AC

## 2023-05-28 NOTE — Progress Notes (Unsigned)
 GI Office Note    Referring Provider: Avis Epley, PA* Primary Care Physician:  Ladon Applebaum  Primary Gastroenterologist: Roetta Sessions, MD   Chief Complaint   Chief Complaint  Patient presents with   Follow-up    Having worsening issues with reflux despite being on pantoprazole    History of Present Illness   Cheryl Aguilar is a 38 y.o. female presenting today for follow up. Last seen 04/2022. H/o abdominal pain, GERD, constipation.   Labs 01/2023: WBC 9.8, Hgb 12.6, Platelets 356, glu 103, cre 0.43, Tbili <0.2, Alb 4, AP 100, AST 14, ALT 10, TSH 4.45.  Today: Overall doing okay but has noticed that she is having more issues with reflux symptoms with certain foods now. Notes specifically with coffee and lemonade. No dysphagia. No abdominal pain, n/v. BMs ok. History of lumbar radiculopathy. Intermittently on flexeril, meloxicam, and vicodin.    Medications   Current Outpatient Medications  Medication Sig Dispense Refill   cyclobenzaprine (FLEXERIL) 10 MG tablet Take 1 tablet (10 mg total) by mouth at bedtime. One tablet every night at bedtime as needed for spasm. 30 tablet 0   diazepam (VALIUM) 5 MG tablet Take 1 tablet (5 mg total) by mouth every 6 (six) hours as needed for anxiety. Take before 30 min before mri / do not drive while taking 1 tablet 0   HYDROcodone-acetaminophen (NORCO/VICODIN) 5-325 MG tablet Take 1 tablet by mouth every 6 (six) hours as needed for moderate pain (pain score 4-6). 28 tablet 0   pantoprazole (PROTONIX) 40 MG tablet Take 1 tablet (40 mg total) by mouth daily before breakfast. 90 tablet 3   SPRINTEC 28 0.25-35 MG-MCG tablet Take 1 tablet by mouth daily.     No current facility-administered medications for this visit.    Allergies   Allergies as of 05/28/2023 - Review Complete 05/28/2023  Allergen Reaction Noted   Latex Hives and Itching 09/19/2020       Review of Systems   General: Negative for anorexia,  weight loss, fever, chills, fatigue, weakness. ENT: Negative for hoarseness, difficulty swallowing , nasal congestion. CV: Negative for chest pain, angina, palpitations, dyspnea on exertion, peripheral edema.  Respiratory: Negative for dyspnea at rest, dyspnea on exertion, cough, sputum, wheezing.  GI: See history of present illness. GU:  Negative for dysuria, hematuria, urinary incontinence, urinary frequency, nocturnal urination.  Endo: Negative for unusual weight change.     Physical Exam   BP (!) 102/57 (BP Location: Right Arm, Patient Position: Sitting, Cuff Size: Large)   Pulse 89   Temp 98.1 F (36.7 C) (Oral)   Ht 5\' 4"  (1.626 m)   Wt 189 lb 9.6 oz (86 kg)   LMP  (LMP Unknown)   SpO2 97%   BMI 32.54 kg/m    General: Well-nourished, well-developed in no acute distress.  Eyes: No icterus. Mouth: Oropharyngeal mucosa moist and pink   Abdomen: Bowel sounds are normal, nontender, nondistended, no hepatosplenomegaly or masses,  no abdominal bruits or hernia , no rebound or guarding.  Rectal: not performed  Extremities: No lower extremity edema. No clubbing or deformities. Neuro: Alert and oriented x 4   Skin: Warm and dry, no jaundice.   Psych: Alert and cooperative, normal mood and affect.  Labs   N/a Imaging Studies   No results found.  Assessment/Plan:   GERD: -recently some flare on pantoprazole, patient having to watch diet more closely than usual to prevent reflux -trial of rabeprazole  20mg  30-30minutes before breakfast.  -reinforced anti-reflux measures -she is to call if symptoms don't improve, otherwise ov in one year.      Trudie Fuse. Harles Lied, MHS, PA-C [facility]

## 2023-05-28 NOTE — Patient Instructions (Addendum)
 Stop pantoprazole. Start rabeprazole 20mg  daily before breakfast.  Continue to avoid overeating, acidic foods. Allow at least 3 hours between eating and laying down. See handout provided. Return to the office in one year or sooner if symptoms don't settle down.

## 2023-10-03 ENCOUNTER — Encounter: Payer: Self-pay | Admitting: Radiology

## 2023-12-15 ENCOUNTER — Encounter: Payer: Self-pay | Admitting: Radiology

## 2023-12-16 ENCOUNTER — Ambulatory Visit: Admitting: Orthopedic Surgery

## 2023-12-17 ENCOUNTER — Telehealth: Payer: Self-pay | Admitting: Orthopedic Surgery

## 2023-12-17 NOTE — Telephone Encounter (Signed)
 Tried to return the pt's call to rs, mailbox is full.

## 2024-01-02 ENCOUNTER — Ambulatory Visit: Admitting: Orthopedic Surgery

## 2024-01-02 ENCOUNTER — Encounter: Payer: Self-pay | Admitting: Orthopedic Surgery

## 2024-01-02 VITALS — BP 116/80 | HR 80

## 2024-01-02 DIAGNOSIS — M7061 Trochanteric bursitis, right hip: Secondary | ICD-10-CM

## 2024-01-02 DIAGNOSIS — G8929 Other chronic pain: Secondary | ICD-10-CM | POA: Diagnosis not present

## 2024-01-02 DIAGNOSIS — M5441 Lumbago with sciatica, right side: Secondary | ICD-10-CM | POA: Diagnosis not present

## 2024-01-02 NOTE — Progress Notes (Signed)
 New Patient Visit  Assessment & Plan Right greater trochanteric bursitis Right hip pain consistent with greater trochanteric bursitis. Symptoms align with bursitis rather than previous back issues. - Administered injection at site of tenderness. - Recommended ibuprofen or naproxen. - Suggested topical lidocaine  or menthol patches. - Consider physical therapy if needed. - Advised follow-up with Dr. Eldonna for potential back-related injections if symptoms persist.   Procedure note injection - Right lateral hip   Verbal consent was obtained to inject the Right lateral hip.  Patient localized the pain. Timeout was completed to confirm the site of injection.  The skin was prepped with alcohol and ethyl chloride was sprayed at the injection site.  A 21-gauge needle was used to inject 40 mg of Depo-Medrol  and 1% lidocaine  (4 cc) into the Right lateral hip, directly over the localized tenderness using a direct lateral approach.  There were no complications. A sterile bandage was applied.   Follow-up: Return if symptoms worsen or fail to improve.  Subjective:  Chief Complaint  Patient presents with   Back Pain    LBP w pain radiating down to R calf. Pt states she has the constant sensation of sitting on something on the R side. The weather has made the pain worse      History of Present Illness: Cheryl Aguilar is a 38 y.o. female who presents for evaluation of right lower back pain and the lateral hip pain.  Discussed the use of AI scribe software for clinical note transcription with the patient, who gave verbal consent to proceed.  History of Present Illness Cheryl Aguilar is a 38 year old female who presents with right-sided leg pain radiating from the hip.  The pain radiates down the lateral aspect of her right leg and is exacerbated by sitting, driving, or lying down. She experiences a sensation of sitting on something, though nothing is present. This pain is similar to  previous episodes related to her back, which were relieved by an injection. Symptoms fluctuate, with weather changes affecting her condition.  The pain began a couple of months ago without a specific trigger, primarily affecting the lateral side of her hip and radiating down the leg. There is no numbness or tingling in her feet and no significant lower back pain. She is not taking any pain medications currently.  She experiences mild groin pain but no left hip pain. The pain worsens when the right leg is straightened, and specific areas on the right hip and leg are painful upon palpation.    Review of Systems: No fevers or chills No numbness or tingling No chest pain No shortness of breath No bowel or bladder dysfunction No GI distress No headaches   Medical History:  Past Medical History:  Diagnosis Date   Breast lump 09/07/2020    Past Surgical History:  Procedure Laterality Date   BREAST CYST EXCISION Left 09/19/2020   Procedure: EXCISION OF LEFT BREAST FIBROADENOMA;  Surgeon: Belinda Cough, MD;  Location: [facility];  Service: General;  Laterality: Left;    Family History  Problem Relation Age of Onset   Diabetes Mother    Diabetes Brother    Esophageal cancer Paternal Grandfather    Colon cancer Neg Hx    Crohn's disease Neg Hx    Celiac disease Neg Hx    Social History   Tobacco Use   Smoking status: Never   Smokeless tobacco: Never  Vaping Use   Vaping status: Never Used  Substance Use  Topics   Alcohol use: No   Drug use: No    Allergies  Allergen Reactions   Latex Hives and Itching    Current Meds  Medication Sig   cyclobenzaprine  (FLEXERIL ) 10 MG tablet Take 1 tablet (10 mg total) by mouth at bedtime. One tablet every night at bedtime as needed for spasm.   diazepam  (VALIUM ) 5 MG tablet Take 1 tablet (5 mg total) by mouth every 6 (six) hours as needed for anxiety. Take before 30 min before mri / do not drive while taking    HYDROcodone -acetaminophen  (NORCO/VICODIN) 5-325 MG tablet Take 1 tablet by mouth every 6 (six) hours as needed for moderate pain (pain score 4-6).   RABEprazole  (ACIPHEX ) 20 MG tablet Take 1 tablet (20 mg total) by mouth daily before breakfast.   SPRINTEC 28 0.25-35 MG-MCG tablet Take 1 tablet by mouth daily.    Objective: BP 116/80   Pulse 80   Physical Exam:    General: Alert and oriented. and No acute distress. Gait: Normal gait.  Physical Exam MUSCULOSKELETAL: No pain in left hip. Pain in right posterior thigh on leg straightening. Tenderness over right greater trochanter and in right hip area. Mild tenderness in right lower back.  She has excellent lower body strength bilaterally.  2+ patellar tendon reflexes bilaterally.  Sensation is intact in bilateral lower extremities.   IMAGING: No new imaging obtained today   New Medications:  No orders of the defined types were placed in this encounter.     Portions of this note were completed via Scientist, clinical (histocompatibility and immunogenetics).  Oneil DELENA Horde, MD  01/02/2024 11:22 AM

## 2024-01-02 NOTE — Patient Instructions (Addendum)
 Instructions Following Joint Injections  In clinic today, you received an injection in one of your joints (sometimes more than one).  Occasionally, you can have some pain at the injection site, this is normal.  You can place ice at the injection site, or take over-the-counter medications such as Tylenol  (acetaminophen ) or Advil (ibuprofen).  Please follow all directions listed on the bottle.  If your joint (knee or shoulder) becomes swollen, red or very painful, please contact the clinic for additional assistance.   Two medications were injected, including lidocaine  and a steroid (often referred to as cortisone).  Lidocaine  is effective almost immediately but wears off quickly.  However, the steroid can take a few days to improve your symptoms.  In some cases, it can make your pain worse for a couple of days.  Do not be concerned if this happens as it is common.  You can apply ice or take some over-the-counter medications as needed.   Injections in the same joint cannot be repeated for 3 months.  This helps to limit the risk of an infection in the joint.  If you were to develop an infection in your joint, the best treatment option would be surgery.    Consider follow-up with Dr. Eldonna for repeat injection, if he continued to have similar type pain.
